# Patient Record
Sex: Female | Born: 1975
Health system: Southern US, Community
[De-identification: ages and names within clinical notes are randomized; demographics above are authoritative.]

## PROBLEM LIST (undated history)

## (undated) DIAGNOSIS — M545 Low back pain, unspecified: Secondary | ICD-10-CM

## (undated) DIAGNOSIS — T50902A Poisoning by unspecified drugs, medicaments and biological substances, intentional self-harm, initial encounter: Secondary | ICD-10-CM

## (undated) DIAGNOSIS — I499 Cardiac arrhythmia, unspecified: Secondary | ICD-10-CM

## (undated) DIAGNOSIS — Z8619 Personal history of other infectious and parasitic diseases: Secondary | ICD-10-CM

## (undated) DIAGNOSIS — K219 Gastro-esophageal reflux disease without esophagitis: Secondary | ICD-10-CM

## (undated) DIAGNOSIS — F329 Major depressive disorder, single episode, unspecified: Secondary | ICD-10-CM

## (undated) DIAGNOSIS — G4700 Insomnia, unspecified: Secondary | ICD-10-CM

## (undated) DIAGNOSIS — F32A Depression, unspecified: Secondary | ICD-10-CM

## (undated) DIAGNOSIS — O09529 Supervision of elderly multigravida, unspecified trimester: Secondary | ICD-10-CM

## (undated) DIAGNOSIS — F419 Anxiety disorder, unspecified: Secondary | ICD-10-CM

## (undated) HISTORY — DX: Low back pain: M54.5

## (undated) HISTORY — DX: Personal history of other infectious and parasitic diseases: Z86.19

## (undated) HISTORY — DX: Cardiac arrhythmia, unspecified: I49.9

## (undated) HISTORY — DX: Anxiety disorder, unspecified: F41.9

## (undated) HISTORY — PX: NO PAST SURGERIES: SHX2092

## (undated) HISTORY — DX: Supervision of elderly multigravida, unspecified trimester: O09.529

## (undated) HISTORY — DX: Low back pain, unspecified: M54.50

---

## 2005-05-27 ENCOUNTER — Other Ambulatory Visit: Admission: RE | Admit: 2005-05-27 | Discharge: 2005-05-27 | Payer: Self-pay | Admitting: Family Medicine

## 2006-11-15 ENCOUNTER — Other Ambulatory Visit: Admission: RE | Admit: 2006-11-15 | Discharge: 2006-11-15 | Payer: Self-pay | Admitting: Family Medicine

## 2008-11-28 ENCOUNTER — Encounter: Admission: RE | Admit: 2008-11-28 | Discharge: 2008-12-30 | Payer: Self-pay | Admitting: Family Medicine

## 2011-01-07 DIAGNOSIS — L720 Epidermal cyst: Secondary | ICD-10-CM | POA: Insufficient documentation

## 2011-01-07 DIAGNOSIS — G8929 Other chronic pain: Secondary | ICD-10-CM | POA: Insufficient documentation

## 2011-01-07 DIAGNOSIS — J309 Allergic rhinitis, unspecified: Secondary | ICD-10-CM | POA: Insufficient documentation

## 2011-01-07 DIAGNOSIS — G4700 Insomnia, unspecified: Secondary | ICD-10-CM | POA: Insufficient documentation

## 2011-06-12 DIAGNOSIS — T50902A Poisoning by unspecified drugs, medicaments and biological substances, intentional self-harm, initial encounter: Secondary | ICD-10-CM

## 2011-06-12 HISTORY — DX: Poisoning by unspecified drugs, medicaments and biological substances, intentional self-harm, initial encounter: T50.902A

## 2011-07-08 ENCOUNTER — Emergency Department (HOSPITAL_COMMUNITY)
Admission: EM | Admit: 2011-07-08 | Discharge: 2011-07-08 | Disposition: A | Discharge status: Home / Self Care | Payer: PRIVATE HEALTH INSURANCE | Attending: Emergency Medicine | Admitting: Emergency Medicine

## 2011-07-08 ENCOUNTER — Encounter (HOSPITAL_COMMUNITY): Payer: Self-pay | Admitting: *Deleted

## 2011-07-08 DIAGNOSIS — T4271XA Poisoning by unspecified antiepileptic and sedative-hypnotic drugs, accidental (unintentional), initial encounter: Secondary | ICD-10-CM | POA: Insufficient documentation

## 2011-07-08 DIAGNOSIS — T4272XA Poisoning by unspecified antiepileptic and sedative-hypnotic drugs, intentional self-harm, initial encounter: Secondary | ICD-10-CM | POA: Insufficient documentation

## 2011-07-08 DIAGNOSIS — Y92009 Unspecified place in unspecified non-institutional (private) residence as the place of occurrence of the external cause: Secondary | ICD-10-CM | POA: Insufficient documentation

## 2011-07-08 DIAGNOSIS — T426X1A Poisoning by other antiepileptic and sedative-hypnotic drugs, accidental (unintentional), initial encounter: Secondary | ICD-10-CM | POA: Insufficient documentation

## 2011-07-08 DIAGNOSIS — T426X2A Poisoning by other antiepileptic and sedative-hypnotic drugs, intentional self-harm, initial encounter: Secondary | ICD-10-CM | POA: Insufficient documentation

## 2011-07-08 DIAGNOSIS — F329 Major depressive disorder, single episode, unspecified: Secondary | ICD-10-CM | POA: Insufficient documentation

## 2011-07-08 DIAGNOSIS — Z79899 Other long term (current) drug therapy: Secondary | ICD-10-CM | POA: Insufficient documentation

## 2011-07-08 DIAGNOSIS — F3289 Other specified depressive episodes: Secondary | ICD-10-CM | POA: Insufficient documentation

## 2011-07-08 DIAGNOSIS — T50902A Poisoning by unspecified drugs, medicaments and biological substances, intentional self-harm, initial encounter: Secondary | ICD-10-CM

## 2011-07-08 HISTORY — DX: Poisoning by unspecified drugs, medicaments and biological substances, intentional self-harm, initial encounter: T50.902A

## 2011-07-08 HISTORY — DX: Insomnia, unspecified: G47.00

## 2011-07-08 HISTORY — DX: Depression, unspecified: F32.A

## 2011-07-08 HISTORY — DX: Major depressive disorder, single episode, unspecified: F32.9

## 2011-07-08 LAB — SALICYLATE LEVEL: Salicylate Lvl: <2 mg/dL — ABNORMAL LOW (ref 2.8–20.0)

## 2011-07-08 LAB — COMPREHENSIVE METABOLIC PANEL
AST: 19 U/L (ref 0–37)
CO2: 23 mEq/L (ref 19–32)
Chloride: 106 mEq/L (ref 96–112)
Creatinine, Ser: 0.74 mg/dL (ref 0.50–1.10)
GFR calc Af Amer: >90 mL/min (ref >90)
GFR calc non Af Amer: >90 mL/min (ref >90)
Glucose, Bld: 95 mg/dL (ref 70–99)
Total Bilirubin: 0.2 mg/dL — ABNORMAL LOW (ref 0.3–1.2)

## 2011-07-08 LAB — CBC
HCT: 35.8 % — ABNORMAL LOW (ref 36.0–46.0)
Hemoglobin: 12.6 g/dL (ref 12.0–15.0)
MCV: 89.1 fL (ref 78.0–100.0)
Platelets: 280 10*3/uL (ref 150–400)
RBC: 4.02 MIL/uL (ref 3.87–5.11)
WBC: 7.2 10*3/uL (ref 4.0–10.5)

## 2011-07-08 LAB — ACETAMINOPHEN LEVEL: Acetaminophen (Tylenol), Serum: <15 ug/mL (ref 10–30)

## 2011-07-08 LAB — PREGNANCY, URINE: Preg Test, Ur: NEGATIVE

## 2011-07-08 LAB — RAPID URINE DRUG SCREEN, HOSP PERFORMED
Amphetamines: NOT DETECTED
Tetrahydrocannabinol: NOT DETECTED

## 2011-07-08 MED ORDER — IBUPROFEN 600 MG PO TABS: 600.0000 mg | ORAL_TABLET | Freq: Three times a day (TID) | ORAL | Status: DC | PRN

## 2011-07-08 MED ORDER — TRAZODONE HCL 50 MG PO TABS: 50.0000 mg | ORAL_TABLET | Freq: Every evening | ORAL | Status: DC | PRN

## 2011-07-08 MED ORDER — SERTRALINE HCL 50 MG PO TABS
100.0000 mg | ORAL_TABLET | Freq: Every day | ORAL | Status: DC
  Administered 2011-07-08: 100 mg via ORAL
  Filled 2011-07-08: qty 2

## 2011-07-08 MED ORDER — CETIRIZINE HCL 5 MG/5ML PO SYRP
10.0000 mg | ORAL_SOLUTION | Freq: Every day | ORAL | Status: DC
  Administered 2011-07-08: 10 mg via ORAL
  Filled 2011-07-08: qty 10

## 2011-07-08 NOTE — ED Provider Notes (Addendum)
BP 115/76  Pulse 79  Temp 98.2 F (36.8 C) (Oral)  Resp 16  SpO2 99%  Patient seen and evaluated by me. No complaints at this time.   Forbes Cellar, MD 07/08/11 [phone]  Telepsych reviewed. Recommends discharge home. Pt alert and cooperative at this time.   Forbes Cellar, MD 07/08/11 1007

## 2011-07-08 NOTE — ED Notes (Signed)
Pt. Belonging were taken home by a friend Estate manager/land agent)

## 2011-07-08 NOTE — ED Notes (Signed)
Tele-psych consult in process.

## 2011-07-08 NOTE — ED Provider Notes (Signed)
History     CSN: [id number]  Arrival date & time 07/08/11  0013   First MD Initiated Contact with Patient 07/08/11 0147      Chief Complaint  Patient presents with  . Drug Overdose    (Consider location/radiation/quality/duration/timing/severity/associated sxs/prior treatment) HPI Level 5 Caveat: altered mental status. This is a 36 year old black female who took about 6 Ambien and was at about 8:00 while having a text message argument with a friend. Her messages suggested that she was attempting to kill herself. About 10 PM her text messages became unintelligible and her friend drove from [place] to check on her. He found her to be somnolent and called EMS. She continues to be somnolent but will arouse briefly.  Past Medical History  Diagnosis Date  . Depression   . Insomnia     History reviewed. No pertinent past surgical history.  No family history on file.  History  Substance Use Topics  . Smoking status: Not on file  . Smokeless tobacco: Not on file  . Alcohol Use:     OB History    Grav Para Term Preterm Abortions TAB SAB Ect Mult Living                  Review of Systems  Unable to perform ROS   Allergies  Review of patient's allergies indicates no known allergies.  Home Medications   Current Outpatient Rx  Name Route Sig Dispense Refill  . CETIRIZINE HCL 10 MG PO TABS Oral Take 10 mg by mouth daily.    Marland Kitchen DIPHENHYDRAMINE HCL 25 MG PO TABS Oral Take 50 mg by mouth at bedtime as needed. sleep    . DOXYCYCLINE HYCLATE 100 MG PO TABS Oral Take 100 mg by mouth 2 (two) times daily.    Marland Kitchen HYDROCODONE-ACETAMINOPHEN 5-500 MG PO TABS Oral Take 1 tablet by mouth daily as needed. Severe pain    . IBUPROFEN 800 MG PO TABS Oral Take 800 mg by mouth every 8 (eight) hours as needed. Pain    . LOPERAMIDE HCL 2 MG PO CAPS Oral Take 2 mg by mouth 4 (four) times daily as needed. Diarrheal    . MELOXICAM 15 MG PO TABS Oral Take 15 mg by mouth daily.    Marland Kitchen PHENTERMINE  HCL 37.5 MG PO CAPS Oral Take 37.5 mg by mouth every morning.    Marland Kitchen PROMETHAZINE HCL 25 MG PO TABS Oral Take 25 mg by mouth every 6 (six) hours as needed. nausea    . SERTRALINE HCL 100 MG PO TABS Oral Take 100 mg by mouth daily.    . TRAZODONE HCL 50 MG PO TABS Oral Take 50-100 mg by mouth at bedtime.    Marland Kitchen ZOLPIDEM TARTRATE ER 12.5 MG PO TBCR Oral Take 12.5 mg by mouth at bedtime as needed. Sleep      BP 109/67  Pulse 105  Temp 98.1 F (36.7 C) (Oral)  Resp 26  SpO2 100%  Physical Exam General: Well-developed, well-nourished female in no acute distress; appearance consistent with age of record HENT: normocephalic, atraumatic Eyes: pupils equal round and reactive to light; extraocular muscles intact Neck: supple Heart: regular rate and rhythm Lungs: clear to auscultation bilaterally Abdomen: soft; nondistended Extremities: No deformity; full range of motion; pulses normal Neurologic: Somnolent but arousable; noted to move all extremities  Skin: Warm and dry Psychiatric: Admits to taking Ambien in an attempt to escape    ED Course  Procedures (including critical care time)  MDM   Nursing notes and vitals signs, including pulse oximetry, reviewed.  Summary of this visit's results, reviewed by myself:  Labs:  Results for orders placed during the hospital encounter of 07/08/11  CBC      Component Value Range   WBC 7.2  4.0 - 10.5 K/uL   RBC 4.02  3.87 - 5.11 MIL/uL   Hemoglobin 12.6  12.0 - 15.0 g/dL   HCT 40.9 (*) 81.1 - 91.4 %   MCV 89.1  78.0 - 100.0 fL   MCH 31.3  26.0 - 34.0 pg   MCHC 35.2  30.0 - 36.0 g/dL   RDW 78.2  95.6 - 21.3 %   Platelets 280  150 - 400 K/uL  COMPREHENSIVE METABOLIC PANEL      Component Value Range   Sodium 136  135 - 145 mEq/L   Potassium 4.1  3.5 - 5.1 mEq/L   Chloride 106  96 - 112 mEq/L   CO2 23  19 - 32 mEq/L   Glucose, Bld 95  70 - 99 mg/dL   BUN 17  6 - 23 mg/dL   Creatinine, Ser 0.86  0.50 - 1.10 mg/dL   Calcium 8.8  8.4  - 57.8 mg/dL   Total Protein 6.4  6.0 - 8.3 g/dL   Albumin 3.4 (*) 3.5 - 5.2 g/dL   AST 19  0 - 37 U/L   ALT 16  0 - 35 U/L   Alkaline Phosphatase 73  39 - 117 U/L   Total Bilirubin 0.2 (*) 0.3 - 1.2 mg/dL   GFR calc non Af Amer >90  >90 mL/min   GFR calc Af Amer >90  >90 mL/min  ETHANOL      Component Value Range   Alcohol, Ethyl (B) <11  0 - 11 mg/dL  ACETAMINOPHEN LEVEL      Component Value Range   Acetaminophen (Tylenol), Serum <15.0  10 - 30 ug/mL  URINE RAPID DRUG SCREEN (HOSP PERFORMED)      Component Value Range   Opiates NONE DETECTED  NONE DETECTED   Cocaine NONE DETECTED  NONE DETECTED   Benzodiazepines NONE DETECTED  NONE DETECTED   Amphetamines NONE DETECTED  NONE DETECTED   Tetrahydrocannabinol NONE DETECTED  NONE DETECTED   Barbiturates NONE DETECTED  NONE DETECTED  PREGNANCY, URINE      Component Value Range   Preg Test, Ur NEGATIVE  NEGATIVE  SALICYLATE LEVEL      Component Value Range   Salicylate Lvl <2.0 (*) 2.8 - 20.0 mg/dL      EKG Interpretation:  Date & Time: 07/08/2011 12:20 AM  Rate: 101  Rhythm: sinus tachycardia  QRS Axis: normal  Intervals: PR prolonged  ST/T Wave abnormalities: normal  Conduction Disutrbances:first-degree A-V block   Narrative Interpretation:   Old EKG Reviewed: none available  5:35 AM Patient now awake. ACT will evaluate.        Hanley Seamen, MD 07/08/11 [phone]

## 2011-07-08 NOTE — Discharge Instructions (Signed)
Overdose, Adult     A person can overdose on alcohol, drugs or both by accident or on purpose. If it was on purpose, it is a serious matter. Professional help should be sought. If the overdose was an accident, certain steps should be taken to make sure that it never happens again.  ACCIDENTAL OVERDOSE  Overdosing on prescription medications can be a result of:  · Not understanding the instructions.   · Misreading the label.   · Forgetting that you took a dose and then taking another by mistake. This situation happens a lot.   To make sure this does not happen again:  · Clarify the correct dosage with your caregiver.   · Place the correct dosage in a "pill-minder" container (labeled for each day and time of day).   · Have someone dispense your medicine.      Please be sure to follow-up with your primary care doctor as directed.  INTENTIONAL OVERDOSE  If the overdose was on purpose, it is a serious situation. Taking more than the prescribed amount of medications (including taking someone else's prescription), abusing street drugs or drinking an amount of alcohol that requires medical treatment can show a variety of possible problems. These may indicate you:  · Are depressed or suicidal.   · Are abusing drugs, took too much or combined different drugs to experiment with the effects.   · Mixed alcohol with drugs and did not realize the danger of doing so (this is drug abuse).   · Are suffering addiction to drugs and/or alcohol (also known as chemical dependency).   · Binge drink.   If you have not been referred to a mental health professional for help, it is important that you get help right away. Only a professional can determine which problems may exist and what the best course of treatment may be. It is your responsibility to follow-up with further evaluation or treatment as directed.   Alcohol is responsible for a large number of overdoses and unintended deaths among college-age young adults. Binge drinking is  consuming 4-5 drinks in a short period of time. The amount of alcohol in standard servings of wine (5 oz.), beer (12 oz.) and distilled spirits (1.5 oz., 80 proof) is the same. Beer or wine can be just as dangerous to the binge drinker as "hard" liquor can be.   CONSEQUENCES OF BINGE DRINKING  Alcohol poisoning is the most serious consequence of binge drinking. This is a severe and potentially fatal physical reaction to an alcohol overdose. When too much alcohol is consumed, the brain does not get enough oxygen. The lack of oxygen will eventually cause the brain to shut down the voluntary functions that regulate breathing and heart rate. Symptoms of alcohol poisoning include:  · Vomiting.   · Passing out (unconsciousness).   · Cold, clammy, pale or bluish skin.   · Slow or irregular breathing.   WHAT SHOULD I DO NEXT?  If you have a history of drug abuse or suffer chemical dependency (alcoholism, drug addiction or both), you might consider the following:  · Talk with a qualified substance abuse counselor and consider entering a treatment program.   · Go to a detox facility if necessary.   · If you were attending self-help group meetings, consider returning to them and go often.   · Explore other resources located near you (see sources listed below).   If you are unsure if you have a substance abuse problem, ask yourself the   do while using)?   Do you lie about use or amounts of drugs or alcohol you consume?   Do you need chemicals to get you going?   Do you suffer in work or school performance because of drug or alcohol use?   Do you get sick from drug or alcohol use but continue to use anyway?   Do you need drugs or alcohol to relate to people or feel comfortable in social situations?   Do you use drugs  or alcohol to forget problems?  If you answered "Yes" to any of the above questions, it means you show signs of chemical dependency and a professional evaluation is suggested. The longer the use of drugs and alcohol continues, the problems will become greater. SEEK IMMEDIATE MEDICAL CARE IF:   You feel like you might repeat your problematic behavior.   You need someone to talk to and feel that it should not wait.   You feel you are a danger to yourself or someone else.   You feel like you are having a new reaction to medications you are taking, or you are getting worse after leaving a care center.   You have an overwhelming urge to drink or use drugs.  Addiction cannot be cured, but it can be treated successfully. Treatment centers are listed in the yellow pages under: Cocaine, Narcotics, and Alcoholics Anonymous. Most hospitals and clinics can refer you to a specialized care center. The [place] government maintains a toll-free number for obtaining treatment referrals: (628)251-5229 or (403)130-2556 (TDD) and maintains a website: http://findtreatment.RockToxic.pl. Other websites for additional information are: www.mentalhealth.RockToxic.pl. and GreatestFeeling.tn. In [place] treatment resources are listed in each [place] with listings available under [employer] for [employer] or similar titles. Document Released: 12/31/2002 Document Revised: 12/17/2010 Document Reviewed: 11/22/2007 [facility] Patient Information 2012 [place], [place].  RESOURCE GUIDE  Dental Problems  Patients with Medicaid: [facility] [phone] [address]                                           [phone] Phone:  [phone]                                                   Phone:  [phone]  If unable to pay or uninsured, contact:  [facility] or [facility]. to become qualified for the adult dental clinic.  Chronic Pain Problems Contact [facility]  [phone] Patients need to be referred by their primary care doctor.  Insufficient Money for Medicine Contact United Way:  call "211" or [facility] (475)670-6941.  No Primary Care Doctor Call [facility]  [phone] Other agencies that provide inexpensive medical care    [facility]  [phone]    [facility]  [phone]    [facility]  [phone]    [facility]  [phone]    [facility]  [phone]    [facility]  [phone]  [facility]  [phone] [place] [facility]  [phone] [facility] Mental Health  800 G6628420 (emergency services [phone])  Abuse/Neglect [facility] Child Abuse Hotline (470) 716-0916 [facility] Child Abuse Hotline (217)682-0020 (After Hours)  Emergency Shelter [facility] 8733564062  [facility] [place] [facility] (640) 245-4526 [facility] [phone]  MRSA Hotline #:   (570)313-2657    [facility] Resources  [facility]  United Way                           [facility] Dept. [address] [place]                     [address]         [address] [place] [address]  [place]ondell Reveal Phone:  [phone]                                  Phone:  [phone]                   Phone:  [phone]  [facility] Mental Health Phone:  [phone]  [facility] Child Abuse Hotline 818 001 9618 [phone] (After Hours)

## 2011-07-08 NOTE — ED Notes (Signed)
ZOX:WRUE<AV> Expected date:07/07/11<BR> Expected time:11:57 PM<BR> Means of arrival:Ambulance<BR> Comments:<BR> Overdose 5 ambien

## 2011-07-08 NOTE — ED Notes (Signed)
Pt informed physician won't be in until 1300. Pt reports she took the pills, because someone made her mad when they wouldn't answer their phone. Pt states she "feels okay".

## 2011-07-08 NOTE — ED Notes (Signed)
Tele-psych consult completed.   

## 2011-07-08 NOTE — ED Notes (Signed)
Pt texted a friend telling them she was going to kill herself; texted picture of empty pill bottle; When friend arrived at pt house she was unresponsive -- he woke her up and she admitted to taking 5 ambien.  Per EMS pt lethargic. VSS.

## 2011-10-07 DIAGNOSIS — R55 Syncope and collapse: Secondary | ICD-10-CM | POA: Insufficient documentation

## 2012-01-12 NOTE — L&D Delivery Note (Signed)
Delivery Note At 10:27 AM a viable female was delivered via  (Presentation: ;  ).  APGAR: , ; weight .   Placenta status:spont>>intact , .  Cord:  with the following complications: .  Cord pH: not sent  Anesthesia: Epidural  Episiotomy: none Lacerations:sec deg Suture Repair: 3.0 vicryl rapide Est. Blood Loss (mL): 300  Mom to postpartum.  Baby to nursery-stable.  Shawne Bulow M 09/16/2012, 10:40 AM

## 2012-02-11 LAB — OB RESULTS CONSOLE GC/CHLAMYDIA: Gonorrhea: NEGATIVE

## 2012-02-11 LAB — OB RESULTS CONSOLE ABO/RH

## 2012-02-11 LAB — OB RESULTS CONSOLE RUBELLA ANTIBODY, IGM: Rubella: IMMUNE

## 2012-02-11 LAB — OB RESULTS CONSOLE HIV ANTIBODY (ROUTINE TESTING): HIV: NONREACTIVE

## 2012-03-23 ENCOUNTER — Encounter: Payer: Self-pay | Admitting: Certified Nurse Midwife

## 2012-09-08 ENCOUNTER — Telehealth (HOSPITAL_COMMUNITY): Payer: Self-pay | Admitting: *Deleted

## 2012-09-08 ENCOUNTER — Encounter (HOSPITAL_COMMUNITY): Payer: Self-pay | Admitting: *Deleted

## 2012-09-08 NOTE — Telephone Encounter (Signed)
Preadmission screen  

## 2012-09-15 ENCOUNTER — Inpatient Hospital Stay (HOSPITAL_COMMUNITY)
Admission: AD | Admit: 2012-09-15 | Discharge: 2012-09-18 | DRG: 775 | Disposition: A | Discharge status: Home / Self Care | Payer: 59 | Source: Ambulatory Visit | Attending: Obstetrics and Gynecology | Admitting: Obstetrics and Gynecology

## 2012-09-15 ENCOUNTER — Encounter (HOSPITAL_COMMUNITY): Payer: Self-pay | Admitting: *Deleted

## 2012-09-15 DIAGNOSIS — O09529 Supervision of elderly multigravida, unspecified trimester: Secondary | ICD-10-CM | POA: Diagnosis present

## 2012-09-15 LAB — URINALYSIS, ROUTINE W REFLEX MICROSCOPIC
Bilirubin Urine: NEGATIVE
Specific Gravity, Urine: >1.03 — ABNORMAL HIGH (ref 1.005–1.030)
Urobilinogen, UA: 0.2 mg/dL (ref 0.0–1.0)

## 2012-09-15 LAB — URINE MICROSCOPIC-ADD ON

## 2012-09-15 MED ORDER — LACTATED RINGERS IV SOLN
INTRAVENOUS | Status: DC
  Administered 2012-09-15 – 2012-09-16 (×3): via INTRAVENOUS

## 2012-09-15 NOTE — MAU Note (Signed)
Pt state U/C's started today about 1300.  Unable to time them.  Bloody show without ROM.  Good fetal movement.

## 2012-09-15 NOTE — MAU Note (Addendum)
SVE unchanged. Discussed with pt no progression and probable discharge home. Pt and family upset about how much pain pt is in and concerned about insurance if she goes home and has to return, pt aware that we can send her home with something for pain. Pt"s husband states they are adamantly opposed to going home and pt states she cannot go home in this much pain. Call to dr Marcelle Overlie and notified him of SVE and conversation with pt. Notified that family wishes to speak to him, per dr Marcelle Overlie , admit pt.

## 2012-09-16 ENCOUNTER — Encounter (HOSPITAL_COMMUNITY): Payer: Self-pay | Admitting: *Deleted

## 2012-09-16 ENCOUNTER — Encounter (HOSPITAL_COMMUNITY): Payer: Self-pay | Admitting: Anesthesiology

## 2012-09-16 ENCOUNTER — Inpatient Hospital Stay (HOSPITAL_COMMUNITY): Payer: 59 | Admitting: Anesthesiology

## 2012-09-16 LAB — CBC
HCT: 33.3 % — ABNORMAL LOW (ref 36.0–46.0)
MCH: 28.5 pg (ref 26.0–34.0)
MCHC: 33.9 g/dL (ref 30.0–36.0)
MCV: 84.1 fL (ref 78.0–100.0)
Platelets: 266 10*3/uL (ref 150–400)
RDW: 17 % — ABNORMAL HIGH (ref 11.5–15.5)
WBC: 12.3 10*3/uL — ABNORMAL HIGH (ref 4.0–10.5)

## 2012-09-16 LAB — ABO/RH: ABO/RH(D): B POS

## 2012-09-16 MED ORDER — BISACODYL 10 MG RE SUPP: 10.0000 mg | Freq: Every day | RECTAL | Status: DC | PRN

## 2012-09-16 MED ORDER — DIBUCAINE 1 % RE OINT: 1.0000 "application " | TOPICAL_OINTMENT | RECTAL | Status: DC | PRN

## 2012-09-16 MED ORDER — ACETAMINOPHEN 325 MG PO TABS: 650.0000 mg | ORAL_TABLET | ORAL | Status: DC | PRN

## 2012-09-16 MED ORDER — FLEET ENEMA 7-19 GM/118ML RE ENEM: 1.0000 | ENEMA | Freq: Every day | RECTAL | Status: DC | PRN

## 2012-09-16 MED ORDER — OXYTOCIN BOLUS FROM INFUSION
500.0000 mL | INTRAVENOUS | Status: DC
  Administered 2012-09-16: 500 mL via INTRAVENOUS

## 2012-09-16 MED ORDER — OXYTOCIN 40 UNITS IN LACTATED RINGERS INFUSION - SIMPLE MED
62.5000 mL/h | INTRAVENOUS | Status: DC
  Filled 2012-09-16: qty 1000

## 2012-09-16 MED ORDER — TETANUS-DIPHTH-ACELL PERTUSSIS 5-2.5-18.5 LF-MCG/0.5 IM SUSP: 0.5000 mL | Freq: Once | INTRAMUSCULAR | Status: DC

## 2012-09-16 MED ORDER — LACTATED RINGERS IV SOLN
500.0000 mL | INTRAVENOUS | Status: DC | PRN
  Administered 2012-09-16 (×2): 500 mL via INTRAVENOUS

## 2012-09-16 MED ORDER — SENNOSIDES-DOCUSATE SODIUM 8.6-50 MG PO TABS
2.0000 | ORAL_TABLET | Freq: Every day | ORAL | Status: DC
  Administered 2012-09-16 – 2012-09-17 (×2): 2 via ORAL

## 2012-09-16 MED ORDER — CITRIC ACID-SODIUM CITRATE 334-500 MG/5ML PO SOLN: 30.0000 mL | ORAL | Status: DC | PRN

## 2012-09-16 MED ORDER — ZOLPIDEM TARTRATE 5 MG PO TABS
5.0000 mg | ORAL_TABLET | Freq: Every evening | ORAL | Status: DC | PRN
  Administered 2012-09-17: 5 mg via ORAL
  Filled 2012-09-16 (×2): qty 1

## 2012-09-16 MED ORDER — PHENYLEPHRINE 40 MCG/ML (10ML) SYRINGE FOR IV PUSH (FOR BLOOD PRESSURE SUPPORT)
80.0000 ug | PREFILLED_SYRINGE | INTRAVENOUS | Status: DC | PRN
  Filled 2012-09-16: qty 2
  Filled 2012-09-16: qty 5

## 2012-09-16 MED ORDER — IBUPROFEN 600 MG PO TABS
600.0000 mg | ORAL_TABLET | Freq: Four times a day (QID) | ORAL | Status: DC | PRN
  Administered 2012-09-16: 600 mg via ORAL
  Filled 2012-09-16: qty 1

## 2012-09-16 MED ORDER — LIDOCAINE HCL (PF) 1 % IJ SOLN
INTRAMUSCULAR | Status: DC | PRN
  Administered 2012-09-16 (×2): 9 mL

## 2012-09-16 MED ORDER — BENZOCAINE-MENTHOL 20-0.5 % EX AERO
1.0000 "application " | INHALATION_SPRAY | CUTANEOUS | Status: DC | PRN
  Filled 2012-09-16: qty 56

## 2012-09-16 MED ORDER — LORATADINE 10 MG PO TABS
10.0000 mg | ORAL_TABLET | Freq: Every day | ORAL | Status: DC
  Administered 2012-09-18: 10 mg via ORAL
  Filled 2012-09-16 (×3): qty 1

## 2012-09-16 MED ORDER — SERTRALINE HCL 25 MG PO TABS
25.0000 mg | ORAL_TABLET | Freq: Every day | ORAL | Status: DC
  Administered 2012-09-16 – 2012-09-18 (×3): 25 mg via ORAL
  Filled 2012-09-16 (×3): qty 1

## 2012-09-16 MED ORDER — DIPHENHYDRAMINE HCL 50 MG/ML IJ SOLN: 12.5000 mg | INTRAMUSCULAR | Status: DC | PRN

## 2012-09-16 MED ORDER — LIDOCAINE HCL (PF) 1 % IJ SOLN
30.0000 mL | INTRAMUSCULAR | Status: DC | PRN
  Filled 2012-09-16 (×2): qty 30

## 2012-09-16 MED ORDER — LACTATED RINGERS IV SOLN
500.0000 mL | Freq: Once | INTRAVENOUS | Status: AC
  Administered 2012-09-16: 500 mL via INTRAVENOUS

## 2012-09-16 MED ORDER — WITCH HAZEL-GLYCERIN EX PADS: 1.0000 "application " | MEDICATED_PAD | CUTANEOUS | Status: DC | PRN

## 2012-09-16 MED ORDER — PRENATAL MULTIVITAMIN CH
1.0000 | ORAL_TABLET | Freq: Every day | ORAL | Status: DC
  Administered 2012-09-16 – 2012-09-17 (×2): 1 via ORAL
  Filled 2012-09-16 (×2): qty 1

## 2012-09-16 MED ORDER — ONDANSETRON HCL 4 MG/2ML IJ SOLN: 4.0000 mg | INTRAMUSCULAR | Status: DC | PRN

## 2012-09-16 MED ORDER — EPHEDRINE 5 MG/ML INJ
10.0000 mg | INTRAVENOUS | Status: DC | PRN
  Filled 2012-09-16: qty 2
  Filled 2012-09-16: qty 4

## 2012-09-16 MED ORDER — EPHEDRINE 5 MG/ML INJ
10.0000 mg | INTRAVENOUS | Status: DC | PRN
  Filled 2012-09-16: qty 2

## 2012-09-16 MED ORDER — FENTANYL 2.5 MCG/ML BUPIVACAINE 1/10 % EPIDURAL INFUSION (WH - ANES)
INTRAMUSCULAR | Status: DC | PRN
Start: 2012-09-16 — End: 2012-09-17
  Administered 2012-09-16: 14 mL/h via EPIDURAL

## 2012-09-16 MED ORDER — OXYCODONE-ACETAMINOPHEN 5-325 MG PO TABS
1.0000 | ORAL_TABLET | Freq: Four times a day (QID) | ORAL | Status: DC | PRN
  Administered 2012-09-16 (×2): 1 via ORAL
  Filled 2012-09-16 (×2): qty 1

## 2012-09-16 MED ORDER — ONDANSETRON HCL 4 MG/2ML IJ SOLN: 4.0000 mg | Freq: Four times a day (QID) | INTRAMUSCULAR | Status: DC | PRN

## 2012-09-16 MED ORDER — MEASLES, MUMPS & RUBELLA VAC ~~LOC~~ INJ
0.5000 mL | INJECTION | Freq: Once | SUBCUTANEOUS | Status: DC
  Filled 2012-09-16: qty 0.5

## 2012-09-16 MED ORDER — PHENYLEPHRINE 40 MCG/ML (10ML) SYRINGE FOR IV PUSH (FOR BLOOD PRESSURE SUPPORT)
80.0000 ug | PREFILLED_SYRINGE | INTRAVENOUS | Status: DC | PRN
  Filled 2012-09-16: qty 2

## 2012-09-16 MED ORDER — OXYCODONE-ACETAMINOPHEN 5-325 MG PO TABS: 1.0000 | ORAL_TABLET | ORAL | Status: DC | PRN

## 2012-09-16 MED ORDER — SIMETHICONE 80 MG PO CHEW: 80.0000 mg | CHEWABLE_TABLET | ORAL | Status: DC | PRN

## 2012-09-16 MED ORDER — LANOLIN HYDROUS EX OINT: TOPICAL_OINTMENT | CUTANEOUS | Status: DC | PRN

## 2012-09-16 MED ORDER — FLEET ENEMA 7-19 GM/118ML RE ENEM: 1.0000 | ENEMA | RECTAL | Status: DC | PRN

## 2012-09-16 MED ORDER — IBUPROFEN 800 MG PO TABS
800.0000 mg | ORAL_TABLET | Freq: Three times a day (TID) | ORAL | Status: DC | PRN
  Administered 2012-09-17 – 2012-09-18 (×3): 800 mg via ORAL
  Filled 2012-09-16 (×4): qty 1

## 2012-09-16 MED ORDER — FENTANYL 2.5 MCG/ML BUPIVACAINE 1/10 % EPIDURAL INFUSION (WH - ANES)
14.0000 mL/h | INTRAMUSCULAR | Status: DC | PRN
  Administered 2012-09-16: 14 mL/h via EPIDURAL
  Filled 2012-09-16 (×2): qty 125

## 2012-09-16 MED ORDER — DIPHENHYDRAMINE HCL 25 MG PO CAPS: 25.0000 mg | ORAL_CAPSULE | Freq: Four times a day (QID) | ORAL | Status: DC | PRN

## 2012-09-16 MED ORDER — ONDANSETRON HCL 4 MG PO TABS: 4.0000 mg | ORAL_TABLET | ORAL | Status: DC | PRN

## 2012-09-16 NOTE — Anesthesia Procedure Notes (Signed)
Epidural Patient location during procedure: OB Start time: 09/16/2012 2:52 AM End time: 09/16/2012 2:56 AM  Staffing Anesthesiologist: Sandrea Hughs Performed by: anesthesiologist   Preanesthetic Checklist Completed: patient identified, surgical consent, pre-op evaluation, timeout performed, IV checked, risks and benefits discussed and monitors and equipment checked  Epidural Patient position: sitting Prep: site prepped and draped and DuraPrep Patient monitoring: continuous pulse ox and blood pressure Approach: midline Injection technique: LOR air  Needle:  Needle type: Tuohy  Needle gauge: 17 G Needle length: 9 cm and 9 Needle insertion depth: 6 cm Catheter type: closed end flexible Catheter size: 19 Gauge Catheter at skin depth: 11 cm Test dose: negative and Other  Assessment Sensory level: T9 Events: blood not aspirated, injection not painful, no injection resistance, negative IV test and no paresthesia  Additional Notes Reason for block:procedure for pain

## 2012-09-16 NOTE — Anesthesia Preprocedure Evaluation (Signed)
Anesthesia Evaluation  Patient identified by MRN, date of birth, ID band Patient awake    Reviewed: Allergy & Precautions, H&P , NPO status , Patient's Chart, lab work & pertinent test results  Airway Mallampati: II TM Distance: >3 FB Neck ROM: full    Dental no notable dental hx.    Pulmonary neg pulmonary ROS,    Pulmonary exam normal       Cardiovascular negative cardio ROS      Neuro/Psych PSYCHIATRIC DISORDERS Anxiety Depression negative neurological ROS     GI/Hepatic negative GI ROS, Neg liver ROS,   Endo/Other  Morbid obesity  Renal/GU negative Renal ROS     Musculoskeletal   Abdominal (+) + obese,   Peds  Hematology negative hematology ROS (+)   Anesthesia Other Findings   Reproductive/Obstetrics (+) Pregnancy                           Anesthesia Physical Anesthesia Plan  ASA: III  Anesthesia Plan: Epidural   Post-op Pain Management:    Induction:   Airway Management Planned:   Additional Equipment:   Intra-op Plan:   Post-operative Plan:   Informed Consent: I have reviewed the patients History and Physical, chart, labs and discussed the procedure including the risks, benefits and alternatives for the proposed anesthesia with the patient or authorized representative who has indicated his/her understanding and acceptance.     Plan Discussed with:   Anesthesia Plan Comments:         Anesthesia Quick Evaluation

## 2012-09-16 NOTE — H&P (Signed)
Maria Schaefer is a 37 y.o. female presenting for labor. Maternal Medical History:  Reason for admission: Contractions.   Contractions: Onset was 3-5 hours ago.   Frequency: regular.   Perceived severity is moderate.    Fetal activity: Perceived fetal activity is normal.   Last perceived fetal movement was within the past hour.      OB History   Grav Para Term Preterm Abortions TAB SAB Ect Mult Living   2 0   1  1   0     Past Medical History  Diagnosis Date  . Insomnia   . Hx of varicella   . Dysrhythmia     PVSs  . Anxiety   . Depression     tried to harm herself once  . Intermittent low back pain   . AMA (advanced maternal age) multigravida 35+    Past Surgical History  Procedure Laterality Date  . No past surgeries     Family History: family history includes Diabetes in her sister; Hypertension in her sister. Social History:  reports that she has never smoked. She has never used smokeless tobacco. She reports that she does not drink alcohol or use illicit drugs.   Prenatal Transfer Tool  Maternal Diabetes: No Genetic Screening: Normal Maternal Ultrasounds/Referrals: Normal Fetal Ultrasounds or other Referrals:  None Maternal Substance Abuse:  No Significant Maternal Medications:  Meds include: Zoloft Significant Maternal Lab Results:  None Other Comments:  None  ROS  Dilation: 7 Effacement (%): 100 Station: -1 Exam by:: Bertram Millard, RN Blood pressure 104/62, pulse 82, temperature 98.4 F (36.9 C), temperature source Oral, resp. rate 20, height 5\' 7"  (1.702 m), weight 246 lb (111.585 kg), last menstrual period 12/14/2011, SpO2 97.00%. Maternal Exam:  Uterine Assessment: Contraction strength is moderate.  Contraction frequency is regular.   Abdomen: Patient reports no abdominal tenderness. Fundal height is term FH, FHR 142.   Estimated fetal weight is AGA.   Fetal presentation: vertex  Introitus: Normal vulva. Normal vagina.  Amniotic fluid character:  clear.  Pelvis: adequate for delivery.   Cervix: Cervix evaluated by digital exam.     Physical Exam  Constitutional: She is oriented to person, place, and time. She appears well-developed and well-nourished.  HENT:  Head: Normocephalic and atraumatic.  Neck: Normal range of motion. Neck supple.  Cardiovascular: Normal rate and regular rhythm.   Respiratory: Effort normal and breath sounds normal.  GI:  Term FH, FHR 142  Genitourinary:  Now 9/C/+1>>>AROM>>clr AF  Musculoskeletal: Normal range of motion.  Neurological: She is alert and oriented to person, place, and time.    Prenatal labs: ABO, Rh: --/--/B POS, B POS (09/06 0011) Antibody: NEG (09/06 0011) Rubella: Immune (01/31 0000) RPR: NON REACTIVE (09/06 0011)  HBsAg: Negative (01/31 0000)  HIV: Non-reactive (01/31 0000)  GBS: Negative (08/29 0000)   Assessment/Plan: Term preg, labor   Maria Schaefer 09/16/2012, 7:17 AM

## 2012-09-17 LAB — CBC
Hemoglobin: 11.2 g/dL — ABNORMAL LOW (ref 12.0–15.0)
MCV: 85.5 fL (ref 78.0–100.0)
Platelets: 270 10*3/uL (ref 150–400)
RBC: 3.94 MIL/uL (ref 3.87–5.11)
WBC: 14.9 10*3/uL — ABNORMAL HIGH (ref 4.0–10.5)

## 2012-09-17 MED ORDER — IBUPROFEN 800 MG PO TABS: 800.0000 mg | ORAL_TABLET | Freq: Three times a day (TID) | ORAL | Status: DC | PRN

## 2012-09-17 MED ORDER — HYDROCODONE-ACETAMINOPHEN 5-500 MG PO TABS: 1.0000 | ORAL_TABLET | Freq: Every day | ORAL | Status: DC | PRN

## 2012-09-17 NOTE — Progress Notes (Signed)
Post Partum Day 1 Subjective: no complaints  Objective: Blood pressure 131/70, pulse 91, temperature 98.1 F (36.7 C), temperature source Oral, resp. rate 19, height 5\' 7"  (1.702 m), weight 246 lb (111.585 kg), last menstrual period 12/14/2011, SpO2 99.00%, unknown if currently breastfeeding.  Physical Exam:  General: alert Lochia: appropriate Uterine Fundus: firm Incision: healing well DVT Evaluation: No evidence of DVT seen on physical exam.   Recent Labs  09/16/12 0011 09/17/12 0636  HGB 11.3* 11.2*  HCT 33.3* 33.7*    Assessment/Plan: Discharge home   LOS: 2 days   Maria Schaefer M 09/17/2012, 11:03 AM

## 2012-09-17 NOTE — Discharge Summary (Signed)
Obstetric Discharge Summary Reason for Admission: onset of labor Prenatal Procedures: none Intrapartum Procedures: spontaneous vaginal delivery Postpartum Procedures: none Complications-Operative and Postpartum: none Hemoglobin  Date Value Range Status  09/17/2012 11.2* 12.0 - 15.0 g/dL Final     HCT  Date Value Range Status  09/17/2012 33.7* 36.0 - 46.0 % Final    Physical Exam:  General: alert Lochia: appropriate Uterine Fundus: firm Incision: healing well DVT Evaluation: No evidence of DVT seen on physical exam.  Discharge Diagnoses: Term Pregnancy-delivered  Discharge Information: Date: 09/17/2012 Activity: pelvic rest Diet: routine Medications: PNV, Ibuprofen and Percocet Condition: stable Instructions: refer to practice specific booklet Discharge to: home Follow-up Information   Follow up with Meriel Pica, MD. Call in 6 weeks.   Specialty:  Obstetrics and Gynecology   Contact information:   [address] [place] [address] [phone]       Newborn Data: Live born female  Birth Weight: 8 lb 4.1 oz (3745 g) APGAR: 8, 9  Home with mother.  Maria Schaefer M 09/17/2012, 11:06 AM

## 2012-09-17 NOTE — Anesthesia Postprocedure Evaluation (Signed)
Anesthesia Post Note  Patient: Maria Schaefer  Procedure(s) Performed: * No procedures listed *  Anesthesia type: Epidural  Patient location: Mother/Baby  Post pain: Pain level controlled  Post assessment: Post-op Vital signs reviewed  Last Vitals:  Filed Vitals:   09/17/12 0557  BP: 131/70  Pulse: 91  Temp: 36.7 C  Resp: 19    Post vital signs: Reviewed  Level of consciousness:alert  Complications: No apparent anesthesia complications

## 2012-09-17 NOTE — Progress Notes (Signed)

## 2012-09-18 ENCOUNTER — Ambulatory Visit: Payer: Self-pay

## 2012-09-18 ENCOUNTER — Inpatient Hospital Stay (HOSPITAL_COMMUNITY): Admission: RE | Admit: 2012-09-18 | Payer: PRIVATE HEALTH INSURANCE | Source: Ambulatory Visit

## 2012-09-18 NOTE — Lactation Note (Signed)
This note was copied from the chart of [facility]. Lactation Consultation Note  Patient Name: Maria Janeshia Ciliberto QIONG'E Date: 09/18/2012  Mother has been feeding baby at the breast with the shield and giving formula by bottle at her request. Breast are soft, scant colostrum hand expressed and nipples are tender. Left nipple is flat and will erect with pre-pumping. Mother applying a #20 Nipple shield when she latches her baby. She has increased formula over breast feeding attempts. She can apply her nipple shield independently and can report evidence of a good latch and signs of milk transfer. Assisted mother with latch, baby did well and in a rhythmic pattern of sucking when I left the room. Mother was given comfort gels for sore nipples and flange size was increased to 27mm for comfort. Mother has a breast pump at home and plans to pump and give EBM once in as well. Called back to room. Small amount of blood noted in the shield and mother mother said baby pinched and twisted his mouth causing increase nipple pain. [facility]. Mother wants to "keep working on things" and will call if needs assistance. Informed of Lactation support and service post  D/C.    Maternal Data    Feeding    LATCH Score/Interventions                      Lactation Tools Discussed/Used     Consult Status      Omar Person 09/18/2012, 7:19 PM

## 2012-09-18 NOTE — Progress Notes (Signed)
UR chart review completed.  

## 2012-09-18 NOTE — Progress Notes (Signed)
Post Partum Day 2 Subjective: no complaints, up ad lib, voiding, tolerating PO and + flatus. Patient had planned to go home yesterday. BAby was running low grade temp as well as she was experiencing difficulty with breastfeeding. Baby is afebrile this morning.   Objective: Blood pressure 130/57, pulse 73, temperature 97.9 F (36.6 C), temperature source Oral, resp. rate 17, height 5\' 7"  (1.702 m), weight 246 lb (111.585 kg), last menstrual period 12/14/2011, SpO2 98.00%, unknown if currently breastfeeding.  Physical Exam:  General: alert and cooperative Lochia: appropriate Uterine Fundus: firm Incision: perineum intact DVT Evaluation: No evidence of DVT seen on physical exam. Negative Homan's sign. No cords or calf tenderness. No significant calf/ankle edema.   Recent Labs  09/16/12 0011 09/17/12 0636  HGB 11.3* 11.2*  HCT 33.3* 33.7*    Assessment/Plan: Discharge home   LOS: 3 days   CURTIS,CAROL G 09/18/2012, 8:20 AM

## 2013-01-16 ENCOUNTER — Other Ambulatory Visit: Payer: Self-pay | Admitting: Gastroenterology

## 2013-01-16 DIAGNOSIS — R1013 Epigastric pain: Secondary | ICD-10-CM

## 2013-01-16 DIAGNOSIS — R110 Nausea: Secondary | ICD-10-CM

## 2013-01-29 ENCOUNTER — Ambulatory Visit (HOSPITAL_COMMUNITY): Payer: 59

## 2013-02-14 ENCOUNTER — Ambulatory Visit (HOSPITAL_COMMUNITY)
Admission: RE | Admit: 2013-02-14 | Discharge: 2013-02-14 | Disposition: A | Discharge status: Home / Self Care | Payer: 59 | Source: Ambulatory Visit | Attending: Gastroenterology | Admitting: Gastroenterology

## 2013-02-14 ENCOUNTER — Encounter (HOSPITAL_COMMUNITY)
Admission: RE | Admit: 2013-02-14 | Discharge: 2013-02-14 | Disposition: A | Discharge status: Home / Self Care | Payer: 59 | Source: Ambulatory Visit | Attending: Gastroenterology | Admitting: Gastroenterology

## 2013-02-14 DIAGNOSIS — R1013 Epigastric pain: Secondary | ICD-10-CM

## 2013-02-14 DIAGNOSIS — R110 Nausea: Secondary | ICD-10-CM

## 2013-02-14 DIAGNOSIS — D1803 Hemangioma of intra-abdominal structures: Secondary | ICD-10-CM | POA: Insufficient documentation

## 2013-02-14 DIAGNOSIS — Q619 Cystic kidney disease, unspecified: Secondary | ICD-10-CM | POA: Insufficient documentation

## 2013-02-14 DIAGNOSIS — R1011 Right upper quadrant pain: Secondary | ICD-10-CM | POA: Insufficient documentation

## 2013-02-14 MED ORDER — TECHNETIUM TC 99M MEBROFENIN IV KIT
5.0000 | PACK | Freq: Once | INTRAVENOUS | Status: AC | PRN
  Administered 2013-02-14: 5 via INTRAVENOUS

## 2013-02-14 MED ORDER — SINCALIDE 5 MCG IJ SOLR
INTRAMUSCULAR | Status: AC
  Administered 2013-02-14: 1.9 ug
  Filled 2013-02-14: qty 5

## 2013-02-14 MED ORDER — STERILE WATER FOR INJECTION IJ SOLN
INTRAMUSCULAR | Status: AC
  Filled 2013-02-14: qty 10

## 2013-03-27 ENCOUNTER — Emergency Department (HOSPITAL_COMMUNITY)
Admission: EM | Admit: 2013-03-27 | Discharge: 2013-03-28 | Disposition: A | Discharge status: Home / Self Care | Payer: 59 | Attending: Emergency Medicine | Admitting: Emergency Medicine

## 2013-03-27 DIAGNOSIS — Z3202 Encounter for pregnancy test, result negative: Secondary | ICD-10-CM | POA: Diagnosis not present

## 2013-03-27 DIAGNOSIS — T43591A Poisoning by other antipsychotics and neuroleptics, accidental (unintentional), initial encounter: Secondary | ICD-10-CM | POA: Diagnosis not present

## 2013-03-27 DIAGNOSIS — F329 Major depressive disorder, single episode, unspecified: Secondary | ICD-10-CM | POA: Diagnosis present

## 2013-03-27 DIAGNOSIS — T4272XA Poisoning by unspecified antiepileptic and sedative-hypnotic drugs, intentional self-harm, initial encounter: Secondary | ICD-10-CM | POA: Diagnosis not present

## 2013-03-27 DIAGNOSIS — T438X2A Poisoning by other psychotropic drugs, intentional self-harm, initial encounter: Secondary | ICD-10-CM

## 2013-03-27 DIAGNOSIS — F32A Depression, unspecified: Secondary | ICD-10-CM | POA: Diagnosis present

## 2013-03-27 DIAGNOSIS — T4271XA Poisoning by unspecified antiepileptic and sedative-hypnotic drugs, accidental (unintentional), initial encounter: Secondary | ICD-10-CM | POA: Insufficient documentation

## 2013-03-27 DIAGNOSIS — Z8619 Personal history of other infectious and parasitic diseases: Secondary | ICD-10-CM | POA: Diagnosis not present

## 2013-03-27 DIAGNOSIS — T426X1A Poisoning by other antiepileptic and sedative-hypnotic drugs, accidental (unintentional), initial encounter: Secondary | ICD-10-CM | POA: Diagnosis not present

## 2013-03-27 DIAGNOSIS — T426X2A Poisoning by other antiepileptic and sedative-hypnotic drugs, intentional self-harm, initial encounter: Secondary | ICD-10-CM | POA: Insufficient documentation

## 2013-03-27 DIAGNOSIS — T50901A Poisoning by unspecified drugs, medicaments and biological substances, accidental (unintentional), initial encounter: Secondary | ICD-10-CM | POA: Diagnosis present

## 2013-03-27 DIAGNOSIS — T43502A Poisoning by unspecified antipsychotics and neuroleptics, intentional self-harm, initial encounter: Secondary | ICD-10-CM | POA: Diagnosis not present

## 2013-03-27 DIAGNOSIS — T50902A Poisoning by unspecified drugs, medicaments and biological substances, intentional self-harm, initial encounter: Secondary | ICD-10-CM

## 2013-03-27 DIAGNOSIS — Z8659 Personal history of other mental and behavioral disorders: Secondary | ICD-10-CM | POA: Diagnosis not present

## 2013-03-27 DIAGNOSIS — Z8679 Personal history of other diseases of the circulatory system: Secondary | ICD-10-CM | POA: Diagnosis not present

## 2013-03-27 DIAGNOSIS — Z008 Encounter for other general examination: Secondary | ICD-10-CM | POA: Diagnosis present

## 2013-03-27 LAB — RAPID URINE DRUG SCREEN, HOSP PERFORMED
Amphetamines: NOT DETECTED
BARBITURATES: NOT DETECTED
Benzodiazepines: NOT DETECTED
Cocaine: NOT DETECTED
OPIATES: NOT DETECTED
TETRAHYDROCANNABINOL: NOT DETECTED

## 2013-03-27 LAB — CBC WITH DIFFERENTIAL/PLATELET
BASOS ABS: 0 10*3/uL (ref 0.0–0.1)
BASOS PCT: 0 % (ref 0–1)
EOS PCT: 0 % (ref 0–5)
Eosinophils Absolute: 0 10*3/uL (ref 0.0–0.7)
HEMATOCRIT: 37.2 % (ref 36.0–46.0)
Hemoglobin: 12.8 g/dL (ref 12.0–15.0)
LYMPHS PCT: 22 % (ref 12–46)
Lymphs Abs: 1.7 10*3/uL (ref 0.7–4.0)
MCH: 30.1 pg (ref 26.0–34.0)
MCHC: 34.4 g/dL (ref 30.0–36.0)
MCV: 87.5 fL (ref 78.0–100.0)
MONO ABS: 0.3 10*3/uL (ref 0.1–1.0)
Monocytes Relative: 3 % (ref 3–12)
Neutro Abs: 5.9 10*3/uL (ref 1.7–7.7)
Neutrophils Relative %: 74 % (ref 43–77)
PLATELETS: 393 10*3/uL (ref 150–400)
RBC: 4.25 MIL/uL (ref 3.87–5.11)
RDW: 12.4 % (ref 11.5–15.5)
WBC: 8 10*3/uL (ref 4.0–10.5)

## 2013-03-27 LAB — URINALYSIS, ROUTINE W REFLEX MICROSCOPIC
Bilirubin Urine: NEGATIVE
GLUCOSE, UA: NEGATIVE mg/dL
KETONES UR: 15 mg/dL — AB
NITRITE: NEGATIVE
PH: 5.5 (ref 5.0–8.0)
Protein, ur: NEGATIVE mg/dL
SPECIFIC GRAVITY, URINE: 1.021 (ref 1.005–1.030)
Urobilinogen, UA: 0.2 mg/dL (ref 0.0–1.0)

## 2013-03-27 LAB — URINE MICROSCOPIC-ADD ON

## 2013-03-27 LAB — PROTIME-INR
INR: 0.96 (ref 0.00–1.49)
Prothrombin Time: 12.6 seconds (ref 11.6–15.2)

## 2013-03-27 LAB — COMPREHENSIVE METABOLIC PANEL
ALBUMIN: 3.7 g/dL (ref 3.5–5.2)
ALT: 11 U/L (ref 0–35)
AST: 13 U/L (ref 0–37)
Alkaline Phosphatase: 101 U/L (ref 39–117)
BUN: 6 mg/dL (ref 6–23)
CALCIUM: 9.6 mg/dL (ref 8.4–10.5)
CO2: 24 mEq/L (ref 19–32)
CREATININE: 0.81 mg/dL (ref 0.50–1.10)
Chloride: 104 mEq/L (ref 96–112)
GFR calc Af Amer: >90 mL/min (ref >90)
GFR calc non Af Amer: >90 mL/min (ref >90)
Glucose, Bld: 91 mg/dL (ref 70–99)
Potassium: 3.5 mEq/L — ABNORMAL LOW (ref 3.7–5.3)
SODIUM: 141 meq/L (ref 137–147)
TOTAL PROTEIN: 7.2 g/dL (ref 6.0–8.3)
Total Bilirubin: 0.7 mg/dL (ref 0.3–1.2)

## 2013-03-27 LAB — ACETAMINOPHEN LEVEL: Acetaminophen (Tylenol), Serum: <15 ug/mL (ref 10–30)

## 2013-03-27 LAB — ETHANOL: Alcohol, Ethyl (B): <11 mg/dL (ref 0–11)

## 2013-03-27 LAB — SALICYLATE LEVEL
Salicylate Lvl: <2 mg/dL — ABNORMAL LOW (ref 2.8–20.0)
Salicylate Lvl: <2 mg/dL — ABNORMAL LOW (ref 2.8–20.0)

## 2013-03-27 LAB — PREGNANCY, URINE: PREG TEST UR: NEGATIVE

## 2013-03-27 MED ORDER — SODIUM CHLORIDE 0.9 % IV SOLN
INTRAVENOUS | Status: DC
  Administered 2013-03-27: 19:00:00 via INTRAVENOUS

## 2013-03-27 NOTE — ED Notes (Addendum)
Per EMs. Pt told friend she overdosed on her home medications. Pt took 4 of her vistaril, 3 ambiens and one phenergan. Pt drowsy with EMS. Pt O2 sat 100% on RA.

## 2013-03-27 NOTE — ED Provider Notes (Signed)
CSN: [id number]     Arrival date & time 03/27/13  1817 History   First MD Initiated Contact with Patient 03/27/13 1821     Chief Complaint  Patient presents with  . Drug Overdose  . Medical Clearance    Patient is a 38 y.o. female presenting with Overdose. The history is provided by the patient and the EMS personnel. The history is limited by the condition of the patient (AMS, lethargic).  Drug Overdose  Pt was seen at 1845. Per EMS, pt's friend and pt: pt s/p OD on several of her home medications. States she took 4 vistaril, 1 phenergan and 3 Ambien tablets today. Pt initially stated she "didn't know" when she took the meds, then stated "at 4pm."  Pt's friend states pt told her that she took the meds in [place]. On arrival to the ED, pt states she "needed to sleep." Pt refuses to speak further with me.    Past Medical History  Diagnosis Date  . Insomnia   . Hx of varicella   . Dysrhythmia     PVSs  . Anxiety   . Depression     tried to harm herself once  . Intermittent low back pain   . AMA (advanced maternal age) multigravida 35+    Past Surgical History  Procedure Laterality Date  . No past surgeries     Family History  Problem Relation Age of Onset  . Hypertension Sister   . Diabetes Sister    History  Substance Use Topics  . Smoking status: Never Smoker   . Smokeless tobacco: Never Used  . Alcohol Use: No   OB History   Grav Para Term Preterm Abortions TAB SAB Ect Mult Living   2 1 1  1  1   1      Review of Systems  Unable to perform ROS: Mental status change      Allergies  Review of patient's allergies indicates no known allergies.  Home Medications   Current Outpatient Rx  Name  Route  Sig  Dispense  Refill  . HYDROcodone-acetaminophen (VICODIN) 5-500 MG per tablet   Oral   Take 1 tablet by mouth daily as needed. Severe pain   30 tablet   0   . ibuprofen (ADVIL,MOTRIN) 800 MG tablet   Oral   Take 1 tablet (800 mg total) by mouth every 8 (eight)  hours as needed. Pain   30 tablet   0    BP 122/70  Pulse 107  Temp(Src) 98.9 F (37.2 C) (Oral)  Resp 17  SpO2 96% Physical Exam 1850: Physical examination:  Nursing notes reviewed; Vital signs and O2 SAT reviewed;  Constitutional: Well developed, Well nourished, Well hydrated, In no acute distress; Head:  Normocephalic, atraumatic; Eyes: EOMI, PERRL, No scleral icterus; ENMT: Mouth and pharynx normal, Mucous membranes moist; Neck: Supple, Full range of motion, No lymphadenopathy; Cardiovascular: Regular rate and rhythm, No gallop; Respiratory: Breath sounds clear & equal bilaterally, No rales, rhonchi, wheezes.  Speaking full sentences with ease, Normal respiratory effort/excursion; Chest: Nontender, Movement normal; Abdomen: Soft, Nontender, Nondistended, Normal bowel sounds; Genitourinary: No CVA tenderness; Extremities: Pulses normal, No tenderness, No edema, No calf edema or asymmetry.; Neuro: Lethargic, but opens her eyes to her name. Speech slurred. No facial droop. Moves all extremities on stretcher spontaneously and to command without apparent gross focal motor deficits.; Skin: Color normal, Warm, Dry.; Psych:  Affect flat, poor eye contact.   ED Course  Procedures  EKG Interpretation   Date/Time:  Tuesday March 27 2013 18:41:50 EDT Ventricular Rate:  110 PR Interval:  156 QRS Duration: 82 QT Interval:  345 QTC Calculation: 467 R Axis:   47 Text Interpretation:  Sinus tachycardia Baseline wander When compared with  ECG of 07/08/2011 First degree A-V block is no longer Present Confirmed by  [facility]  MD, Nicholos JohnsKATHLEEN [phone]([id number]) on 03/27/2013 7:00:30 PM      MDM  MDM Reviewed: previous chart, nursing note and vitals Reviewed previous: labs Interpretation: labs and x-ray    Results for orders placed during the hospital encounter of 03/27/13  ACETAMINOPHEN LEVEL      Result Value Ref Range   Acetaminophen (Tylenol), Serum <15.0  10 - 30 ug/mL  ETHANOL      Result  Value Ref Range   Alcohol, Ethyl (B) <11  0 - 11 mg/dL  SALICYLATE LEVEL      Result Value Ref Range   Salicylate Lvl <2.0 (*) 2.8 - 20.0 mg/dL  URINE RAPID DRUG SCREEN (HOSP PERFORMED)      Result Value Ref Range   Opiates NONE DETECTED  NONE DETECTED   Cocaine NONE DETECTED  NONE DETECTED   Benzodiazepines NONE DETECTED  NONE DETECTED   Amphetamines NONE DETECTED  NONE DETECTED   Tetrahydrocannabinol NONE DETECTED  NONE DETECTED   Barbiturates NONE DETECTED  NONE DETECTED  CBC WITH DIFFERENTIAL      Result Value Ref Range   WBC 8.0  4.0 - 10.5 K/uL   RBC 4.25  3.87 - 5.11 MIL/uL   Hemoglobin 12.8  12.0 - 15.0 g/dL   HCT 47.837.2  29.536.0 - 62.146.0 %   MCV 87.5  78.0 - 100.0 fL   MCH 30.1  26.0 - 34.0 pg   MCHC 34.4  30.0 - 36.0 g/dL   RDW 30.812.4  65.711.5 - 84.615.5 %   Platelets 393  150 - 400 K/uL   Neutrophils Relative % 74  43 - 77 %   Neutro Abs 5.9  1.7 - 7.7 K/uL   Lymphocytes Relative 22  12 - 46 %   Lymphs Abs 1.7  0.7 - 4.0 K/uL   Monocytes Relative 3  3 - 12 %   Monocytes Absolute 0.3  0.1 - 1.0 K/uL   Eosinophils Relative 0  0 - 5 %   Eosinophils Absolute 0.0  0.0 - 0.7 K/uL   Basophils Relative 0  0 - 1 %   Basophils Absolute 0.0  0.0 - 0.1 K/uL  COMPREHENSIVE METABOLIC PANEL      Result Value Ref Range   Sodium 141  137 - 147 mEq/L   Potassium 3.5 (*) 3.7 - 5.3 mEq/L   Chloride 104  96 - 112 mEq/L   CO2 24  19 - 32 mEq/L   Glucose, Bld 91  70 - 99 mg/dL   BUN 6  6 - 23 mg/dL   Creatinine, Ser 9.620.81  0.50 - 1.10 mg/dL   Calcium 9.6  8.4 - 95.210.5 mg/dL   Total Protein 7.2  6.0 - 8.3 g/dL   Albumin 3.7  3.5 - 5.2 g/dL   AST 13  0 - 37 U/L   ALT 11  0 - 35 U/L   Alkaline Phosphatase 101  39 - 117 U/L   Total Bilirubin 0.7  0.3 - 1.2 mg/dL   GFR calc non Af Amer >90  >90 mL/min   GFR calc Af Amer >90  >90 mL/min  PROTIME-INR  Result Value Ref Range   Prothrombin Time 12.6  11.6 - 15.2 seconds   INR 0.96  0.00 - 1.49  URINALYSIS, ROUTINE W REFLEX MICROSCOPIC       Result Value Ref Range   Color, Urine YELLOW  YELLOW   APPearance CLOUDY (*) CLEAR   Specific Gravity, Urine 1.021  1.005 - 1.030   pH 5.5  5.0 - 8.0   Glucose, UA NEGATIVE  NEGATIVE mg/dL   Hgb urine dipstick LARGE (*) NEGATIVE   Bilirubin Urine NEGATIVE  NEGATIVE   Ketones, ur 15 (*) NEGATIVE mg/dL   Protein, ur NEGATIVE  NEGATIVE mg/dL   Urobilinogen, UA 0.2  0.0 - 1.0 mg/dL   Nitrite NEGATIVE  NEGATIVE   Leukocytes, UA TRACE (*) NEGATIVE  PREGNANCY, URINE      Result Value Ref Range   Preg Test, Ur NEGATIVE  NEGATIVE  SALICYLATE LEVEL      Result Value Ref Range   Salicylate Lvl <3.8 (*) 2.8 - 20.0 mg/dL  ACETAMINOPHEN LEVEL      Result Value Ref Range   Acetaminophen (Tylenol), Serum <15.0  10 - 30 ug/mL  URINE MICROSCOPIC-ADD ON      Result Value Ref Range   Squamous Epithelial / LPF MANY (*) RARE   WBC, UA 0-2  <3 WBC/hpf   RBC / HPF 7-10  <3 RBC/hpf   Bacteria, UA RARE  RARE    2345:  Pt now more awake/alert. Continues to refuse to speak with me regarding events PTA. VS remain stable, resps easy, neuro non-focal. TTS eval pending.     Alfonzo Feller, DO 03/27/13 2354

## 2013-03-27 NOTE — ED Notes (Signed)
Bed: DP82 Expected date:  Expected time:  Means of arrival:  Comments: OD, anxiety

## 2013-03-28 ENCOUNTER — Encounter (HOSPITAL_COMMUNITY): Payer: Self-pay | Admitting: Registered Nurse

## 2013-03-28 DIAGNOSIS — F32A Depression, unspecified: Secondary | ICD-10-CM | POA: Diagnosis present

## 2013-03-28 DIAGNOSIS — T50901A Poisoning by unspecified drugs, medicaments and biological substances, accidental (unintentional), initial encounter: Secondary | ICD-10-CM | POA: Diagnosis present

## 2013-03-28 DIAGNOSIS — F329 Major depressive disorder, single episode, unspecified: Secondary | ICD-10-CM | POA: Diagnosis present

## 2013-03-28 MED ORDER — ZOLPIDEM TARTRATE 5 MG PO TABS: 5.0000 mg | ORAL_TABLET | Freq: Every evening | ORAL | Status: DC | PRN

## 2013-03-28 MED ORDER — IBUPROFEN 200 MG PO TABS: 600.0000 mg | ORAL_TABLET | Freq: Three times a day (TID) | ORAL | Status: DC | PRN

## 2013-03-28 MED ORDER — SERTRALINE HCL 50 MG PO TABS: 100.0000 mg | ORAL_TABLET | Freq: Every day | ORAL | Status: DC

## 2013-03-28 MED ORDER — SERTRALINE HCL 100 MG PO TABS: 100.0000 mg | ORAL_TABLET | Freq: Every day | ORAL | Status: DC

## 2013-03-28 MED ORDER — ALUM & MAG HYDROXIDE-SIMETH 200-200-20 MG/5ML PO SUSP: 30.0000 mL | ORAL | Status: DC | PRN

## 2013-03-28 MED ORDER — HYDROXYZINE HCL 25 MG PO TABS: 25.0000 mg | ORAL_TABLET | Freq: Four times a day (QID) | ORAL | Status: DC | PRN

## 2013-03-28 MED ORDER — ONDANSETRON HCL 4 MG PO TABS: 4.0000 mg | ORAL_TABLET | Freq: Three times a day (TID) | ORAL | Status: DC | PRN

## 2013-03-28 MED ORDER — ACETAMINOPHEN 325 MG PO TABS: 650.0000 mg | ORAL_TABLET | ORAL | Status: DC | PRN

## 2013-03-28 MED ORDER — HYDROXYZINE HCL 25 MG PO TABS: 25.0000 mg | ORAL_TABLET | Freq: Three times a day (TID) | ORAL | Status: DC | PRN

## 2013-03-28 NOTE — Discharge Instructions (Signed)
Overdose, Adult  A person can overdose on alcohol, drugs or both by accident or on purpose. If it was on purpose, it is a serious matter. Professional help should be sought. If the overdose was an accident, certain steps should be taken to make sure that it never happens again.  ACCIDENTAL OVERDOSE  Overdosing on prescription medications can be a result of:  · Not understanding the instructions.  · Misreading the label.  · Forgetting that you took a dose and then taking another by mistake. This situation happens a lot.  To make sure this does not happen again:  · Clarify the correct dosage with your caregiver.  · Place the correct dosage in a "pill-minder" container (labeled for each day and time of day).  · Have someone dispense your medicine.  Please be sure to follow-up with your primary care doctor as directed.  INTENTIONAL OVERDOSE  If the overdose was on purpose, it is a serious situation. Taking more than the prescribed amount of medications (including taking someone else's prescription), abusing street drugs or drinking an amount of alcohol that requires medical treatment can show a variety of possible problems. These may indicate you:  · Are depressed or suicidal.  · Are abusing drugs, took too much or combined different drugs to experiment with the effects.  · Mixed alcohol with drugs and did not realize the danger of doing so (this is drug abuse).  · Are suffering addiction to drugs and/or alcohol (also known as chemical dependency).  · Binge drink.  If you have not been referred to a mental health professional for help, it is important that you get help right away. Only a professional can determine which problems may exist and what the best course of treatment may be. It is your responsibility to follow-up with further evaluation or treatment as directed.   Alcohol is responsible for a large number of overdoses and unintended deaths among college-age young adults. Binge drinking is consuming 4-5 drinks  in a short period of time. The amount of alcohol in standard servings of wine (5 oz.), beer (12 oz.) and distilled spirits (1.5 oz., 80 proof) is the same. Beer or wine can be just as dangerous to the binge drinker as "hard" liquor can be.   CONSEQUENCES OF BINGE DRINKING  Alcohol poisoning is the most serious consequence of binge drinking. This is a severe and potentially fatal physical reaction to an alcohol overdose. When too much alcohol is consumed, the brain does not get enough oxygen. The lack of oxygen will eventually cause the brain to shut down the voluntary functions that regulate breathing and heart rate. Symptoms of alcohol poisoning include:  · Vomiting.  · Passing out (unconsciousness).  · Cold, clammy, pale or bluish skin.  · Slow or irregular breathing.  WHAT SHOULD I DO NEXT?  If you have a history of drug abuse or suffer chemical dependency (alcoholism, drug addiction or both), you might consider the following:  · Talk with a qualified substance abuse counselor and consider entering a treatment program.  · Go to a detox facility if necessary.  · If you were attending self-help group meetings, consider returning to them and go often.  · Explore other resources located near you (see sources listed below).  If you are unsure if you have a substance abuse problem, ask yourself the following questions:  · Have you been told by friends or family that drugs/alcohol has become a problem?  · Do you get into fights   when drinking or using drugs?  · Do you have blackouts (not remembering what you do while using)?  · Do you lie about use or amounts of drugs or alcohol you consume?  · Do you need chemicals to get you going?  · Do you suffer in work or school performance because of drug or alcohol use?  · Do you get sick from drug or alcohol use but continue to use anyway?  · Do you need drugs or alcohol to relate to people or feel comfortable in social situations?  · Do you use drugs or alcohol to forget  problems?  If you answered "Yes" to any of the above questions, it means you show signs of chemical dependency and a professional evaluation is suggested. The longer the use of drugs and alcohol continues, the problems will become greater.  SEEK IMMEDIATE MEDICAL CARE IF:   · You feel like you might repeat your problematic behavior.  · You need someone to talk to and feel that it should not wait.  · You feel you are a danger to yourself or someone else.  · You feel like you are having a new reaction to medications you are taking, or you are getting worse after leaving a care center.  · You have an overwhelming urge to drink or use drugs.  Addiction cannot be cured, but it can be treated successfully. Treatment centers are listed in the yellow pages under: Cocaine, Narcotics, and Alcoholics Anonymous. Most hospitals and clinics can refer you to a specialized care center. The [place] government maintains a toll-free number for obtaining treatment referrals: 1-800-662-4357 or 1-800-487-4889 (TDD) and maintains a website: http://findtreatment.samhsa.gov. Other websites for additional information are: www.mentalhealth.samhsa.gov. and www.nida.gov.  In [place] treatment resources are listed in each Province with listings available under [employer] or similar titles.  Document Released: 12/31/2002 Document Revised: 03/22/2011 Document Reviewed: 11/22/2007  ExitCare® Patient Information ©2014 [employer].

## 2013-03-28 NOTE — BH Assessment (Signed)
Tele Assessment Note   Maria Schaefer is an 38 y.o. female who presents by EMS after her friend suspected she had made a suicide attempt.  Maria Schaefer reports that she was feeling a little sick so she took several phenergan and vistiril in an attempt to get some rest.  She states that her friend knows that she gets down sometimes and may have been worried about her.  She reports she woke up with the police knocking down her door because her friend must have thought she took some sleeping pills.  Earlier in her ED visit, Maria Schaefer admitted to taking, 4 vistaril, 1 phenergan and 3 Ambien.  She denies that she was attempting suicide today, but states she has SI "once in a blue moon."  She states she doesn't think that has been within the last six months.  When asked if she ever thinks of a plan during those moments she stated, "somtes something simple like sleeping pills.  So when I wake up God sees me as a person  I don't think of it as suicide though just closing your eyes at hat horrible painful day and when you open your eyes it's all brand new."  She is somewhat drowsy, but speaks almost romantically of this image.  She reports that she's been down lately and that she has multiple stressors including bills and that she recently caught her guy friend cheating on her-this man is the father of her 70 old son, so she is going through court to set up shared custody for him, but that's expensive.  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  She reports one previous overdose, on Ambien, in 2012.  She states she was not hospitalized beyond her ED visit.  She currently sees [facility] at [facility] and has an appointment on Monday, but is going to see [facility] at [facility] on Thursday because she's thinking of switching to their practice because they incorporate faith into their counseling.  She denies HI, SA, or AVH.  This Probation officer consulted with EDP, Maria Schaefer, who agrees the  patient will benefit from waiting in the Emergency Department until morning when she will see the psychiatrist due to her intentional overdose and romanticization of suicide, but denial of current ideation or intent.  This Probation officer also consulted with [facility] NP, Maria [facility].      Axis I: Major Depression, Recurrent severe Axis II: Deferred Axis III:  Past Medical History  Diagnosis Date  . Insomnia   . Hx of varicella   . Dysrhythmia     PVSs  . Anxiety   . Depression     tried to harm herself once  . Intermittent low back pain   . AMA (advanced maternal age) multigravida 35+    Axis IV: economic problems, problems related to legal system/crime and problems with primary support group Axis V: 41-50 serious symptoms  Past Medical History:  Past Medical History  Diagnosis Date  . Insomnia   . Hx of varicella   . Dysrhythmia     PVSs  . Anxiety   . Depression     tried to harm herself once  . Intermittent low back pain   . AMA (advanced maternal age) multigravida 35+     Past Surgical History  Procedure Laterality Date  . No past surgeries      Family History:  Family History  Problem Relation Age of Onset  .  Hypertension Sister   . Diabetes Sister     Social History:  reports that she has never smoked. She has never used smokeless tobacco. She reports that she does not drink alcohol or use illicit drugs.  Additional Social History:  Alcohol / Drug Use History of alcohol / drug use?: No history of alcohol / drug abuse  CIWA: CIWA-Ar BP: 127/87 mmHg Pulse Rate: 68 COWS:    Allergies: No Known Allergies  Home Medications:  (Not in a hospital admission)  OB/GYN Status:  No LMP recorded.  General Assessment Data Location of Assessment: [facility] ED Is this a Tele or Face-to-Face Assessment?: Tele Assessment Is this an Initial Assessment or a Re-assessment for this encounter?: Initial Assessment Living Arrangements: Alone (shares custody of 6 mo son) Can pt return to  current living arrangement?: Yes Admission Status: Voluntary Is patient capable of signing voluntary admission?: Yes Transfer from: [facility] Referral Source:  (EMS)     [facility] Living Arrangements: Alone (shares custody of 6 mo son) Name of Therapist: [facility]  Education Status Is patient currently in school?: No Highest grade of school patient has completed: College  Risk to self Suicidal Ideation: No-Not Currently/Within Last 6 Months Suicidal Intent: No Is patient at risk for suicide?: Yes Suicidal Plan?: No-Not Currently/Within Last 6 Months (thoughts of overdosing) Access to Means: Yes Specify Access to Suicidal Means: Rx medications What has been your use of drugs/alcohol within the last 12 months?: denies Previous Attempts/Gestures: Yes How many times?: 1 Triggers for Past Attempts: Other personal contacts Intentional Self Injurious Behavior: None Family Suicide History: No Recent stressful Scha[facility] event(s): Legal Issues;Turmoil (Comment) (boyfriend cheated, custody issues for 41 month old son) Persecutory voices/beliefs?: No Depression: Yes Depression Symptoms: Despondent;Feeling worthless/self pity;Loss of interest in usual pleasures;Guilt;Fatigue;Tearfulness;Insomnia Substance abuse history and/or treatment for substance abuse?: No Suicide prevention information given to non-admitted patients: Not applicable  Risk to Others Homicidal Ideation: No Thoughts of Harm to Others: No Current Homicidal Intent: No Current Homicidal Plan: No Access to Homicidal Means: No History of harm to others?: No Assessment of Violence: None Noted Does patient have access to weapons?: No Criminal Charges Pending?: No Does patient have a court date: No  Psychosis Hallucinations: None noted Delusions: None noted  Mental Status Report Appear/Hygiene: Disheveled Eye Contact: Fair Motor Activity: Freedom of movement Speech:  Logical/coherent;Soft;Slow Level of Consciousness: Quiet/awake;Drowsy Mood: Depressed Affect: Appropriate to circumstance;Blunted Anxiety Level: None Thought Processes: Coherent;Relevant Judgement: Impaired Orientation: Person;Place;Time;Situation Obsessive Compulsive Thoughts/Behaviors: None  Cognitive Functioning Concentration: Normal Memory: Remote Intact;Recent Intact IQ: Average Insight: Fair Impulse Control: Poor Appetite: Poor Weight Loss: 9 Weight Gain: 0 Sleep: Decreased Total Hours of Sleep:  (fleeting-short periods) Vegetative Symptoms: None  ADLScreening [facility] Assessment Services) Patient's cognitive ability adequate to safely complete daily activities?: Yes Patient able to express need for assistance with ADLs?: Yes Independently performs ADLs?: Yes (appropriate for developmental age)  Prior Inpatient Therapy Prior Inpatient Therapy: No  Prior Outpatient Therapy Prior Outpatient Therapy: Yes Prior Therapy Dates: ongoing Prior Therapy Facilty/Provider(s): [facility], About to start at [facility] Reason for Treatment: Depression  ADL Screening (condition at time of admission) Patient's cognitive ability adequate to safely complete daily activities?: Yes Patient able to express need for assistance with ADLs?: Yes Independently performs ADLs?: Yes (appropriate for developmental age)       Abuse/Neglect Assessment (Assessment to be complete while patient is alone) Physical Abuse: Denies Verbal Abuse: Denies Sexual Abuse: Denies  Exploitation of patient/patient's resources: Denies Values / Beliefs Cultural Requests During Hospitalization: None Spiritual Requests During Hospitalization: None   Advance Directives (For Healthcare) Advance Directive: Patient does not have advance directive;Patient would not like information Nutrition Screen- [facility] Patient's home diet: Regular  Additional Information 1:1 In Past 12 Months?: No CIRT  Risk: No Elopement Risk: No Does patient have medical clearance?: Yes     Disposition:  Disposition Initial Assessment Completed for this Encounter: Yes Disposition of Patient: Other dispositions Other disposition(s): Other (Comment) (Hold for further eval in Am)  Davee [facility]in [facility] 03/28/2013 1:13 AM

## 2013-03-28 NOTE — ED Notes (Signed)
Patient medication secured in pharmacy.

## 2013-03-28 NOTE — Consult Note (Signed)
[facility] Face-to-Face Psychiatry Consult   Reason for Consult:  Depression Referring Physician:  EDP  Maria Schaefer is an 38 y.o. female. Total Time spent with patient: 45 minutes  Assessment: AXIS I:  Major Depression, Recurrent severe AXIS II:  Deferred AXIS III:   Past Medical History  Diagnosis Date  . Insomnia   . Hx of varicella   . Dysrhythmia     PVSs  . Anxiety   . Depression     tried to harm herself once  . Intermittent low back pain   . AMA (advanced maternal age) multigravida 13+    AXIS IV:  other psychosocial or environmental problems AXIS V:  51-60 moderate symptoms  Plan:  No evidence of imminent risk to self or others at present.   Patient does not meet criteria for psychiatric inpatient admission. Supportive therapy provided about ongoing stressors. Discussed crisis plan, support from social network, calling 911, coming to the Emergency Department, and calling Suicide Hotline.  Subjective:   Maria Schaefer is a 38 y.o. female patient.  HPI:  Patient states that she had been feeling overwhelmed working as a Multimedia programmer, being separated from her 70 month old child, and not being with the father of her baby."  Patient states that the father of her child is with someone else and that she still loves him and misses their family.  Patient states that she had been sick for several days and has been to see GI specialist; has been seeing her primary physician and yesterday "I was just sick; and wanted to rest and stop throwing up I took to much medication.  I don't want to die; I want to be here for my child; this is my last 2 weeks of my travel assignment; I want to be closer to home so that I can be with my child; I have mand ans appointment with Dr. Fletcher Anon at [facility] in [place]; I am going to call and see if can move my appointment up.  Patient states that she does have a past history of suicide attempt.  States that she is taking Zoloft for her depression. At  this time patient denies suicidal/homicidal ideation, psychosis, and paranoia.  Patient also states that her job keeps her motivated and can't miss any days.   HPI Elements:   Location:  Worsening depression. Quality:  Taking too much of her mediation. Severity:  Taking too much of her mediation.  . Timing:  yesterday. Patient denies family history of mental illness Past Psychiatric History: Past Medical History  Diagnosis Date  . Insomnia   . Hx of varicella   . Dysrhythmia     PVSs  . Anxiety   . Depression     tried to harm herself once  . Intermittent low back pain   . AMA (advanced maternal age) multigravida 35+     reports that she has never smoked. She has never used smokeless tobacco. She reports that she does not drink alcohol or use illicit drugs. Family History  Problem Relation Age of Onset  . Hypertension Sister   . Diabetes Sister    Family History Substance Abuse: No Family Supports: Yes, List: Living Arrangements: Alone (shares custody of 6 mo son) Can pt return to current living arrangement?: Yes Abuse/Neglect [facility]) Physical Abuse: Denies Verbal Abuse: Denies Sexual Abuse: Denies Allergies:  No Known Allergies  ACT Assessment Complete:  Yes:    Educational Status    Risk to Self: Risk to self  Suicidal Ideation: No-Not Currently/Within Last 6 Months Suicidal Intent: No Is patient at risk for suicide?: Yes Suicidal Plan?: No-Not Currently/Within Last 6 Months (thoughts of overdosing) Access to Means: Yes Specify Access to Suicidal Means: Rx medications What has been your use of drugs/alcohol within the last 12 months?: denies Previous Attempts/Gestures: Yes How many times?: 1 Triggers for Past Attempts: Other personal contacts Intentional Self Injurious Behavior: None Family Suicide History: No Recent stressful life event(s): Legal Issues;Turmoil (Comment) (boyfriend cheated, custody issues for 59 month old son) Persecutory voices/beliefs?:  No Depression: Yes Depression Symptoms: Despondent;Feeling worthless/self pity;Loss of interest in usual pleasures;Guilt;Fatigue;Tearfulness;Insomnia Substance abuse history and/or treatment for substance abuse?: No Suicide prevention information given to non-admitted patients: Not applicable  Risk to Others: Risk to Others Homicidal Ideation: No Thoughts of Harm to Others: No Current Homicidal Intent: No Current Homicidal Plan: No Access to Homicidal Means: No History of harm to others?: No Assessment of Violence: None Noted Does patient have access to weapons?: No Criminal Charges Pending?: No Does patient have a court date: No  Abuse: Abuse/Neglect Assessment (Assessment to be complete while patient is alone) Physical Abuse: Denies Verbal Abuse: Denies Sexual Abuse: Denies Exploitation of patient/patient's resources: Denies  Prior Inpatient Therapy: Prior Inpatient Therapy Prior Inpatient Therapy: No  Prior Outpatient Therapy: Prior Outpatient Therapy Prior Outpatient Therapy: Yes Prior Therapy Dates: ongoing Prior Therapy Facilty/Provider(s): [facility], About to start at [facility] Reason for Treatment: Depression  Additional Information: Additional Information 1:1 In Past 12 Months?: No CIRT Risk: No Elopement Risk: No Does patient have medical clearance?: Yes                  Objective: Blood pressure 128/84, pulse 93, temperature 98.2 F (36.8 C), temperature source Oral, resp. rate 18, SpO2 100.00%.There is no weight on file to calculate BMI. Results for orders placed during the hospital encounter of 03/27/13 (from the past 72 hour(s))  ACETAMINOPHEN LEVEL     Status: None   Collection Time    03/27/13  6:30 PM      Result Value Ref Range   Acetaminophen (Tylenol), Serum <15.0  10 - 30 ug/mL   Comment:            THERAPEUTIC CONCENTRATIONS VARY     SIGNIFICANTLY. A RANGE OF 10-30     ug/mL MAY BE AN EFFECTIVE     CONCENTRATION FOR  MANY PATIENTS.     HOWEVER, SOME ARE BEST TREATED     AT CONCENTRATIONS OUTSIDE THIS     RANGE.     ACETAMINOPHEN CONCENTRATIONS     >150 ug/mL AT 4 HOURS AFTER     INGESTION AND >50 ug/mL AT 12     HOURS AFTER INGESTION ARE     OFTEN ASSOCIATED WITH TOXIC     REACTIONS.  ETHANOL     Status: None   Collection Time    03/27/13  6:30 PM      Result Value Ref Range   Alcohol, Ethyl (B) <11  0 - 11 mg/dL   Comment:            LOWEST DETECTABLE LIMIT FOR     SERUM ALCOHOL IS 11 mg/dL     FOR MEDICAL PURPOSES ONLY  SALICYLATE LEVEL     Status: Abnormal   Collection Time    03/27/13  6:30 PM      Result Value Ref Range   Salicylate Lvl <7.1 (*) 2.8 - 20.0 mg/dL  CBC WITH DIFFERENTIAL     Status: None   Collection Time    03/27/13  6:30 PM      Result Value Ref Range   WBC 8.0  4.0 - 10.5 K/uL   RBC 4.25  3.87 - 5.11 MIL/uL   Hemoglobin 12.8  12.0 - 15.0 g/dL   HCT 37.2  36.0 - 46.0 %   MCV 87.5  78.0 - 100.0 fL   MCH 30.1  26.0 - 34.0 pg   MCHC 34.4  30.0 - 36.0 g/dL   RDW 12.4  11.5 - 15.5 %   Platelets 393  150 - 400 K/uL   Neutrophils Relative % 74  43 - 77 %   Neutro Abs 5.9  1.7 - 7.7 K/uL   Lymphocytes Relative 22  12 - 46 %   Lymphs Abs 1.7  0.7 - 4.0 K/uL   Monocytes Relative 3  3 - 12 %   Monocytes Absolute 0.3  0.1 - 1.0 K/uL   Eosinophils Relative 0  0 - 5 %   Eosinophils Absolute 0.0  0.0 - 0.7 K/uL   Basophils Relative 0  0 - 1 %   Basophils Absolute 0.0  0.0 - 0.1 K/uL  COMPREHENSIVE METABOLIC PANEL     Status: Abnormal   Collection Time    03/27/13  6:30 PM      Result Value Ref Range   Sodium 141  137 - 147 mEq/L   Potassium 3.5 (*) 3.7 - 5.3 mEq/L   Chloride 104  96 - 112 mEq/L   CO2 24  19 - 32 mEq/L   Glucose, Bld 91  70 - 99 mg/dL   BUN 6  6 - 23 mg/dL   Creatinine, Ser 0.81  0.50 - 1.10 mg/dL   Calcium 9.6  8.4 - 10.5 mg/dL   Total Protein 7.2  6.0 - 8.3 g/dL   Albumin 3.7  3.5 - 5.2 g/dL   AST 13  0 - 37 U/L   ALT 11  0 - 35 U/L   Alkaline  Phosphatase 101  39 - 117 U/L   Total Bilirubin 0.7  0.3 - 1.2 mg/dL   GFR calc non Af Amer >90  >90 mL/min   GFR calc Af Amer >90  >90 mL/min   Comment: (NOTE)     The eGFR has been calculated using the CKD EPI equation.     This calculation has not been validated in all clinical situations.     eGFR's persistently <90 mL/min signify possible Chronic Kidney     Disease.  PROTIME-INR     Status: None   Collection Time    03/27/13  6:30 PM      Result Value Ref Range   Prothrombin Time 12.6  11.6 - 15.2 seconds   INR 0.96  0.00 - 1.49  URINE RAPID DRUG SCREEN (HOSP PERFORMED)     Status: None   Collection Time    03/27/13  7:12 PM      Result Value Ref Range   Opiates NONE DETECTED  NONE DETECTED   Cocaine NONE DETECTED  NONE DETECTED   Benzodiazepines NONE DETECTED  NONE DETECTED   Amphetamines NONE DETECTED  NONE DETECTED   Tetrahydrocannabinol NONE DETECTED  NONE DETECTED   Barbiturates NONE DETECTED  NONE DETECTED   Comment:            DRUG SCREEN FOR MEDICAL PURPOSES     ONLY.  IF CONFIRMATION IS NEEDED  FOR ANY PURPOSE, NOTIFY LAB     WITHIN 5 DAYS.                LOWEST DETECTABLE LIMITS     FOR URINE DRUG SCREEN     Drug Class       Cutoff (ng/mL)     Amphetamine      1000     Barbiturate      200     Benzodiazepine   130     Tricyclics       865     Opiates          300     Cocaine          300     THC              50  URINALYSIS, ROUTINE W REFLEX MICROSCOPIC     Status: Abnormal   Collection Time    03/27/13  7:12 PM      Result Value Ref Range   Color, Urine YELLOW  YELLOW   APPearance CLOUDY (*) CLEAR   Specific Gravity, Urine 1.021  1.005 - 1.030   pH 5.5  5.0 - 8.0   Glucose, UA NEGATIVE  NEGATIVE mg/dL   Hgb urine dipstick LARGE (*) NEGATIVE   Bilirubin Urine NEGATIVE  NEGATIVE   Ketones, ur 15 (*) NEGATIVE mg/dL   Protein, ur NEGATIVE  NEGATIVE mg/dL   Urobilinogen, UA 0.2  0.0 - 1.0 mg/dL   Nitrite NEGATIVE  NEGATIVE   Leukocytes, UA TRACE  (*) NEGATIVE  PREGNANCY, URINE     Status: None   Collection Time    03/27/13  7:12 PM      Result Value Ref Range   Preg Test, Ur NEGATIVE  NEGATIVE   Comment:            THE SENSITIVITY OF THIS     METHODOLOGY IS >20 mIU/mL.  URINE MICROSCOPIC-ADD ON     Status: Abnormal   Collection Time    03/27/13  7:12 PM      Result Value Ref Range   Squamous Epithelial / LPF MANY (*) RARE   WBC, UA 0-2  <3 WBC/hpf   RBC / HPF 7-10  <3 RBC/hpf   Bacteria, UA RARE  RARE  SALICYLATE LEVEL     Status: Abnormal   Collection Time    03/27/13 10:30 PM      Result Value Ref Range   Salicylate Lvl <7.8 (*) 2.8 - 20.0 mg/dL  ACETAMINOPHEN LEVEL     Status: None   Collection Time    03/27/13 10:30 PM      Result Value Ref Range   Acetaminophen (Tylenol), Serum <15.0  10 - 30 ug/mL   Comment:            THERAPEUTIC CONCENTRATIONS VARY     SIGNIFICANTLY. A RANGE OF 10-30     ug/mL MAY BE AN EFFECTIVE     CONCENTRATION FOR MANY PATIENTS.     HOWEVER, SOME ARE BEST TREATED     AT CONCENTRATIONS OUTSIDE THIS     RANGE.     ACETAMINOPHEN CONCENTRATIONS     >150 ug/mL AT 4 HOURS AFTER     INGESTION AND >50 ug/mL AT 12     HOURS AFTER INGESTION ARE     OFTEN ASSOCIATED WITH TOXIC     REACTIONS.   Labs are reviewed and are pertinent for no critical or significant findings.  Current  Facility-Administered Medications  Medication Dose Route Frequency Provider Last Rate Last Dose  . 0.9 %  sodium chloride infusion   Intravenous Continuous Alfonzo Feller, DO      . acetaminophen (TYLENOL) tablet 650 mg  650 mg Oral T2I PRN Delora Fuel, MD      . alum & mag hydroxide-simeth (MAALOX/MYLANTA) 200-200-20 MG/5ML suspension 30 mL  30 mL Oral PRN Delora Fuel, MD      . ibuprofen (ADVIL,MOTRIN) tablet 600 mg  600 mg Oral Z1I PRN Delora Fuel, MD      . ondansetron [facility]) tablet 4 mg  4 mg Oral W5Y PRN Delora Fuel, MD      . zolpidem [facility]) tablet 5 mg  5 mg Oral QHS PRN Delora Fuel, MD        Current Outpatient Prescriptions  Medication Sig Dispense Refill  . HYDROcodone-acetaminophen (VICODIN) 5-500 MG per tablet Take 1 tablet by mouth daily as needed. Severe pain  30 tablet  0  . ibuprofen (ADVIL,MOTRIN) 800 MG tablet Take 1 tablet (800 mg total) by mouth every 8 (eight) hours as needed. Pain  30 tablet  0    Psychiatric Specialty Exam:     Blood pressure 128/84, pulse 93, temperature 98.2 F (36.8 C), temperature source Oral, resp. rate 18, SpO2 100.00%.There is no weight on file to calculate BMI.  General Appearance: Casual  Eye Contact::  Good  Speech:  Clear and Coherent and Normal Rate  Volume:  Normal  Mood:  Depressed  Affect:  Congruent  Thought Process:  Circumstantial  Orientation:  Full (Time, Place, and Person)  Thought Content:  WDL  Suicidal Thoughts:  No  Homicidal Thoughts:  No  Memory:  Immediate;   Good Recent;   Good Remote;   Good  Judgement:  Fair  Insight:  Good  Psychomotor Activity:  Normal  Concentration:  Good  Recall:  Good  Fund of Knowledge:Good  Language: Good  Akathisia:  No  Handed:  Right  AIMS (if indicated):     Assets:  Communication Skills Desire for Improvement Housing Resilience Social Support  Sleep:      Musculoskeletal: Strength & Muscle Tone: within normal limits Gait & Station: normal Patient leans: N/A  Treatment Plan Summary: Outpatient services  Discharge Assessment     Demographic Factors:  Female  Total Time spent with patient: 15 minutes  Psychiatric Specialty Exam: Same as above  Musculoskeletal: Same as above  Mental Status Per Nursing Assessment::   On Admission:     Current Mental Status by Physician: Patient denies suicidal/homicidal ideation, psychosis, and paranoia  Loss Factors: Relationship with father of child  Historical Factors: NA  Risk Reduction Factors:   Responsible for children under 57 years of age, Sense of responsibility to family, Religious beliefs about  death, Living with another person, especially a relative and Positive social support  Continued Clinical Symptoms:  Depression  Cognitive Features That Contribute To Risk:  None    Suicide Risk:  Minimal: No identifiable suicidal ideation.  Patients presenting with no risk factors but with morbid ruminations; may be classified as minimal risk based on the severity of the depressive symptoms  Discharge Diagnoses:  Same as above  Plan Of Care/Follow-up recommendations:  Activity:  Resume usual activity Diet:  Resume usual diet  Is patient on multiple antipsychotic therapies at discharge:  No   Has Patient had three or more failed trials of antipsychotic monotherapy by history:  No  Recommended Plan for Multiple  Antipsychotic Therapies: NA   Maria Lewing, FNP-BC 03/28/2013 10:19 AM

## 2013-03-28 NOTE — Progress Notes (Signed)
Per discussion with psychiatrist and NP, Pt psychiatrically stable for dc home and f/u with outpatient provider. .No further Clinical Social Work needs, signing off.   Belia Heman, [place]  ED CSW  03/28/2013 1029am

## 2013-03-28 NOTE — Consult Note (Signed)
Face to face evaluation and I agree with this note 

## 2013-03-28 NOTE — ED Provider Notes (Signed)
TTS evaluation appreciated. She will be kept in the ED for evaluation by psychiatrist in AM.  Delora Fuel, MD 34/03/70 [id number]

## 2013-03-28 NOTE — BH Assessment (Signed)
[facility] Assessment Progress Note      Spoke with patient's provider who reports that she overdosed to find peace.  TTS consult scheduled for 00:25

## 2013-04-01 ENCOUNTER — Encounter (HOSPITAL_COMMUNITY): Payer: Self-pay | Admitting: Emergency Medicine

## 2013-05-23 ENCOUNTER — Ambulatory Visit
Admission: RE | Admit: 2013-05-23 | Discharge: 2013-05-23 | Disposition: A | Discharge status: Home / Self Care | Payer: 59 | Source: Ambulatory Visit | Attending: Family Medicine | Admitting: Family Medicine

## 2013-05-23 ENCOUNTER — Other Ambulatory Visit: Payer: Self-pay | Admitting: Family Medicine

## 2013-05-23 DIAGNOSIS — M542 Cervicalgia: Secondary | ICD-10-CM

## 2013-05-28 ENCOUNTER — Encounter (INDEPENDENT_AMBULATORY_CARE_PROVIDER_SITE_OTHER): Payer: Self-pay | Admitting: Surgery

## 2013-05-29 ENCOUNTER — Ambulatory Visit (INDEPENDENT_AMBULATORY_CARE_PROVIDER_SITE_OTHER): Payer: 59 | Admitting: Surgery

## 2013-05-29 ENCOUNTER — Encounter (INDEPENDENT_AMBULATORY_CARE_PROVIDER_SITE_OTHER): Payer: Self-pay | Admitting: Surgery

## 2013-05-29 VITALS — BP 110/70 | HR 80 | Temp 97.0°F | Resp 14 | Ht 67.0 in | Wt 191.6 lb

## 2013-05-29 DIAGNOSIS — K811 Chronic cholecystitis: Secondary | ICD-10-CM | POA: Insufficient documentation

## 2013-05-29 NOTE — Progress Notes (Signed)
Patient ID: Maria Schaefer, female   DOB: 05/25/1975, 38 y.o.   MRN: [id number]  Chief Complaint  Patient presents with  . New Evaluation    eval GB injection    HPI Maria Schaefer is a 38 y.o. female.   HPI She is referred by Dr. Verdia Kuba for evaluation of nausea and vomiting.  This has been occuring for approx 7 to 8 months.  She will have occasional mild abdominal discomfort.  It occurs randomly after just about anything she eats.  It is now getting worse.  Her workup has been negative including an U/S, HIDA scan, and upper endoscopy.  She denies jaundice.  Past Medical History  Diagnosis Date  . Insomnia   . Hx of varicella   . Dysrhythmia     PVSs  . Anxiety   . Depression     tried to harm herself once  . Intermittent low back pain   . AMA (advanced maternal age) multigravida 72+   . Intentional drug overdose 06/2011    OD on Lorrin Mais    Past Surgical History  Procedure Laterality Date  . No past surgeries      Family History  Problem Relation Age of Onset  . Hypertension Sister   . Diabetes Sister     Social History History  Substance Use Topics  . Smoking status: Never Smoker   . Smokeless tobacco: Never Used  . Alcohol Use: Yes     Comment: occ    No Known Allergies  Current Outpatient Prescriptions  Medication Sig Dispense Refill  . sertraline (ZOLOFT) 100 MG tablet Take 1 tablet (100 mg total) by mouth daily.  30 tablet  1  . zolpidem (AMBIEN CR) 12.5 MG CR tablet Take 12.5 mg by mouth at bedtime as needed for sleep.      Marland Kitchen HYDROcodone-acetaminophen (VICODIN) 5-500 MG per tablet Take 1 tablet by mouth daily as needed. Severe pain  30 tablet  0  . hydrOXYzine (ATARAX/VISTARIL) 25 MG tablet Take 1 tablet (25 mg total) by mouth 4 (four) times daily as needed for anxiety.  30 tablet  1   No current facility-administered medications for this visit.    Review of Systems Review of Systems  Constitutional: Negative for fever, chills and unexpected weight change.   HENT: Negative for congestion, hearing loss, sore throat, trouble swallowing and voice change.   Eyes: Negative for visual disturbance.  Respiratory: Negative for cough and wheezing.   Cardiovascular: Negative for chest pain, palpitations and leg swelling.  Gastrointestinal: Positive for nausea, vomiting, abdominal pain and abdominal distention. Negative for diarrhea, constipation, blood in stool and anal bleeding.  Genitourinary: Negative for hematuria, vaginal bleeding and difficulty urinating.  Musculoskeletal: Negative for arthralgias.  Skin: Negative for rash and wound.  Neurological: Negative for seizures, syncope and headaches.  Hematological: Negative for adenopathy. Does not bruise/bleed easily.  Psychiatric/Behavioral: Negative for confusion.    Blood pressure 110/70, pulse 80, temperature 97 F (36.1 C), temperature source Temporal, resp. rate 14, height 5\' 7"  (1.702 m), weight 191 lb 9.6 oz (86.909 kg).  Physical Exam Physical Exam  Constitutional: She is oriented to person, place, and time. She appears well-developed and well-nourished. No distress.  HENT:  Head: Normocephalic and atraumatic.  Right Ear: External ear normal.  Left Ear: External ear normal.  Nose: Nose normal.  Mouth/Throat: Oropharynx is clear and moist. No oropharyngeal exudate.  Eyes: Conjunctivae are normal. Pupils are equal, round, and reactive to light. Right eye exhibits no  discharge. Left eye exhibits no discharge. No scleral icterus.  Neck: Normal range of motion. Neck supple. No tracheal deviation present.  Cardiovascular: Normal rate, regular rhythm, normal heart sounds and intact distal pulses.   No murmur heard. Pulmonary/Chest: Effort normal and breath sounds normal.  Abdominal: Soft. Bowel sounds are normal. There is tenderness. There is no guarding.  Musculoskeletal: Normal range of motion. She exhibits no edema and no tenderness.  Lymphadenopathy:    She has no cervical adenopathy.   Neurological: She is alert and oriented to person, place, and time.  Skin: Skin is warm and dry. No rash noted. No erythema.  Psychiatric: Her behavior is normal. Judgment normal.    Data Reviewed   Assessment    Chronic cholecystitis     Plan    I do suspect she has chronic cholecystitis and some biliary dyskinesia.  We discussed continued conservative management vs lap chole.  She really wants to proceed with surgery which I think is reasonable.  I discussed the procedure in detail.  The patient was given Neurosurgeon.  We discussed the risks and benefits of a laparoscopic cholecystectomy and possible cholangiogram including, but not limited to bleeding, infection, injury to surrounding structures such as the intestine or liver, bile leak, retained gallstones, need to convert to an open procedure, prolonged diarrhea, blood clots such as  DVT, common bile duct injury, anesthesia risks, and possible need for additional procedures.  She also understands that this may not resolve her symptoms at all. We discussed the typical post-operative recovery course.        Harl Bowie 05/29/2013, 10:49 AM

## 2013-06-05 ENCOUNTER — Telehealth (INDEPENDENT_AMBULATORY_CARE_PROVIDER_SITE_OTHER): Payer: Self-pay | Admitting: Surgery

## 2013-06-05 NOTE — Telephone Encounter (Signed)
06/05/13 patient met with surgery scheduling on 05/29/13 went over financial responsibilities, patient said would call back to schedule.

## 2013-06-28 ENCOUNTER — Encounter (INDEPENDENT_AMBULATORY_CARE_PROVIDER_SITE_OTHER): Payer: Self-pay

## 2013-09-26 DIAGNOSIS — R112 Nausea with vomiting, unspecified: Secondary | ICD-10-CM | POA: Diagnosis present

## 2013-09-26 DIAGNOSIS — R1011 Right upper quadrant pain: Secondary | ICD-10-CM | POA: Diagnosis present

## 2013-11-12 ENCOUNTER — Encounter (INDEPENDENT_AMBULATORY_CARE_PROVIDER_SITE_OTHER): Payer: Self-pay | Admitting: Surgery

## 2014-04-18 ENCOUNTER — Other Ambulatory Visit: Payer: Self-pay | Admitting: Physician Assistant

## 2014-04-18 ENCOUNTER — Ambulatory Visit
Admission: RE | Admit: 2014-04-18 | Discharge: 2014-04-18 | Disposition: A | Discharge status: Home / Self Care | Payer: 59 | Source: Ambulatory Visit | Attending: Physician Assistant | Admitting: Physician Assistant

## 2014-04-18 DIAGNOSIS — M533 Sacrococcygeal disorders, not elsewhere classified: Secondary | ICD-10-CM

## 2014-04-18 DIAGNOSIS — W19XXXA Unspecified fall, initial encounter: Secondary | ICD-10-CM

## 2014-07-22 ENCOUNTER — Emergency Department (HOSPITAL_BASED_OUTPATIENT_CLINIC_OR_DEPARTMENT_OTHER)
Admission: EM | Admit: 2014-07-22 | Discharge: 2014-07-22 | Disposition: A | Discharge status: Home / Self Care | Payer: Managed Care, Other (non HMO) | Attending: Emergency Medicine | Admitting: Emergency Medicine

## 2014-07-22 ENCOUNTER — Encounter (HOSPITAL_BASED_OUTPATIENT_CLINIC_OR_DEPARTMENT_OTHER): Payer: Self-pay | Admitting: *Deleted

## 2014-07-22 DIAGNOSIS — Y9389 Activity, other specified: Secondary | ICD-10-CM | POA: Diagnosis not present

## 2014-07-22 DIAGNOSIS — Y998 Other external cause status: Secondary | ICD-10-CM | POA: Insufficient documentation

## 2014-07-22 DIAGNOSIS — Y9289 Other specified places as the place of occurrence of the external cause: Secondary | ICD-10-CM | POA: Insufficient documentation

## 2014-07-22 DIAGNOSIS — Z8739 Personal history of other diseases of the musculoskeletal system and connective tissue: Secondary | ICD-10-CM | POA: Insufficient documentation

## 2014-07-22 DIAGNOSIS — Z8619 Personal history of other infectious and parasitic diseases: Secondary | ICD-10-CM | POA: Insufficient documentation

## 2014-07-22 DIAGNOSIS — F419 Anxiety disorder, unspecified: Secondary | ICD-10-CM | POA: Insufficient documentation

## 2014-07-22 DIAGNOSIS — I499 Cardiac arrhythmia, unspecified: Secondary | ICD-10-CM | POA: Insufficient documentation

## 2014-07-22 DIAGNOSIS — G4700 Insomnia, unspecified: Secondary | ICD-10-CM | POA: Diagnosis not present

## 2014-07-22 DIAGNOSIS — Z79899 Other long term (current) drug therapy: Secondary | ICD-10-CM | POA: Diagnosis not present

## 2014-07-22 DIAGNOSIS — F329 Major depressive disorder, single episode, unspecified: Secondary | ICD-10-CM | POA: Insufficient documentation

## 2014-07-22 DIAGNOSIS — T7421XA Adult sexual abuse, confirmed, initial encounter: Secondary | ICD-10-CM | POA: Diagnosis not present
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
## 2014-07-22 NOTE — ED Notes (Signed)
Pt here for medical clearance.
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
## 2014-07-22 NOTE — ED Notes (Signed)
SANE nurse at bedside.
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
## 2015-02-19 DIAGNOSIS — L700 Acne vulgaris: Secondary | ICD-10-CM | POA: Insufficient documentation

## 2015-04-03 ENCOUNTER — Other Ambulatory Visit: Payer: Self-pay | Admitting: Physician Assistant

## 2015-04-03 ENCOUNTER — Other Ambulatory Visit (HOSPITAL_COMMUNITY)
Admission: RE | Admit: 2015-04-03 | Discharge: 2015-04-03 | Disposition: A | Discharge status: Home / Self Care | Payer: 59 | Source: Ambulatory Visit | Attending: Family Medicine | Admitting: Family Medicine

## 2015-04-03 DIAGNOSIS — Z124 Encounter for screening for malignant neoplasm of cervix: Secondary | ICD-10-CM | POA: Diagnosis not present

## 2015-04-07 LAB — CYTOLOGY - PAP

## 2015-05-02 ENCOUNTER — Other Ambulatory Visit: Payer: Self-pay

## 2015-05-02 DIAGNOSIS — Z1231 Encounter for screening mammogram for malignant neoplasm of breast: Secondary | ICD-10-CM

## 2015-05-22 ENCOUNTER — Ambulatory Visit
Admission: RE | Admit: 2015-05-22 | Discharge: 2015-05-22 | Disposition: A | Discharge status: Home / Self Care | Payer: 59 | Source: Ambulatory Visit

## 2015-05-22 DIAGNOSIS — Z1231 Encounter for screening mammogram for malignant neoplasm of breast: Secondary | ICD-10-CM

## 2015-05-23 ENCOUNTER — Ambulatory Visit: Payer: 59

## 2016-04-16 ENCOUNTER — Other Ambulatory Visit: Payer: Self-pay | Admitting: Family Medicine

## 2016-04-16 DIAGNOSIS — Z1231 Encounter for screening mammogram for malignant neoplasm of breast: Secondary | ICD-10-CM

## 2016-05-26 ENCOUNTER — Ambulatory Visit: Payer: 59

## 2016-05-27 ENCOUNTER — Ambulatory Visit
Admission: RE | Admit: 2016-05-27 | Discharge: 2016-05-27 | Disposition: A | Discharge status: Home / Self Care | Payer: 59 | Source: Ambulatory Visit | Attending: Family Medicine | Admitting: Family Medicine

## 2016-05-27 DIAGNOSIS — Z1231 Encounter for screening mammogram for malignant neoplasm of breast: Secondary | ICD-10-CM

## 2016-05-31 ENCOUNTER — Other Ambulatory Visit: Payer: Self-pay | Admitting: Family Medicine

## 2016-05-31 DIAGNOSIS — R928 Other abnormal and inconclusive findings on diagnostic imaging of breast: Secondary | ICD-10-CM

## 2016-06-01 ENCOUNTER — Ambulatory Visit
Admission: RE | Admit: 2016-06-01 | Discharge: 2016-06-01 | Disposition: A | Discharge status: Home / Self Care | Payer: 59 | Source: Ambulatory Visit | Attending: Family Medicine | Admitting: Family Medicine

## 2016-06-01 DIAGNOSIS — R928 Other abnormal and inconclusive findings on diagnostic imaging of breast: Secondary | ICD-10-CM
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
## 2017-02-09 DIAGNOSIS — M7542 Impingement syndrome of left shoulder: Secondary | ICD-10-CM | POA: Insufficient documentation

## 2017-04-25 ENCOUNTER — Other Ambulatory Visit: Payer: Self-pay | Admitting: Family Medicine

## 2017-04-25 ENCOUNTER — Other Ambulatory Visit (HOSPITAL_COMMUNITY)
Admission: RE | Admit: 2017-04-25 | Discharge: 2017-04-25 | Disposition: A | Discharge status: Home / Self Care | Payer: 59 | Source: Ambulatory Visit | Attending: Family Medicine | Admitting: Family Medicine

## 2017-04-25 DIAGNOSIS — Z124 Encounter for screening for malignant neoplasm of cervix: Secondary | ICD-10-CM | POA: Insufficient documentation

## 2017-04-27 LAB — CYTOLOGY - PAP
Diagnosis: NEGATIVE
HPV: NOT DETECTED

## 2017-08-19 ENCOUNTER — Other Ambulatory Visit: Payer: Self-pay | Admitting: Family Medicine

## 2017-08-19 DIAGNOSIS — Z1231 Encounter for screening mammogram for malignant neoplasm of breast: Secondary | ICD-10-CM

## 2017-09-23 ENCOUNTER — Ambulatory Visit: Payer: 59

## 2017-09-23 ENCOUNTER — Ambulatory Visit
Admission: RE | Admit: 2017-09-23 | Discharge: 2017-09-23 | Disposition: A | Discharge status: Home / Self Care | Payer: 59 | Source: Ambulatory Visit | Attending: Family Medicine | Admitting: Family Medicine

## 2017-09-23 DIAGNOSIS — Z1231 Encounter for screening mammogram for malignant neoplasm of breast: Secondary | ICD-10-CM

## 2018-10-12 ENCOUNTER — Other Ambulatory Visit: Payer: Self-pay | Admitting: Family Medicine

## 2018-10-12 DIAGNOSIS — Z1231 Encounter for screening mammogram for malignant neoplasm of breast: Secondary | ICD-10-CM

## 2018-11-02 ENCOUNTER — Ambulatory Visit
Admission: RE | Admit: 2018-11-02 | Discharge: 2018-11-02 | Disposition: A | Discharge status: Home / Self Care | Payer: 59 | Source: Ambulatory Visit | Attending: Family Medicine | Admitting: Family Medicine

## 2018-11-02 ENCOUNTER — Other Ambulatory Visit: Payer: Self-pay

## 2018-11-02 DIAGNOSIS — Z1231 Encounter for screening mammogram for malignant neoplasm of breast: Secondary | ICD-10-CM

## 2018-12-01 DIAGNOSIS — M1711 Unilateral primary osteoarthritis, right knee: Secondary | ICD-10-CM | POA: Insufficient documentation

## 2018-12-01 DIAGNOSIS — M1712 Unilateral primary osteoarthritis, left knee: Secondary | ICD-10-CM | POA: Insufficient documentation

## 2018-12-20 DIAGNOSIS — L249 Irritant contact dermatitis, unspecified cause: Secondary | ICD-10-CM | POA: Insufficient documentation

## 2018-12-21 DIAGNOSIS — L280 Lichen simplex chronicus: Secondary | ICD-10-CM | POA: Insufficient documentation

## 2019-08-09 ENCOUNTER — Other Ambulatory Visit: Payer: Self-pay

## 2019-08-09 ENCOUNTER — Encounter: Payer: 59 | Attending: Family Medicine | Admitting: Skilled Nursing Facility1

## 2019-08-09 ENCOUNTER — Encounter: Payer: Self-pay | Admitting: Skilled Nursing Facility1

## 2019-08-09 DIAGNOSIS — E669 Obesity, unspecified: Secondary | ICD-10-CM | POA: Diagnosis not present
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
## 2019-09-10 ENCOUNTER — Ambulatory Visit: Payer: 59 | Admitting: Skilled Nursing Facility1

## 2019-10-04 ENCOUNTER — Other Ambulatory Visit: Payer: Self-pay | Admitting: Family Medicine

## 2019-10-04 DIAGNOSIS — Z1231 Encounter for screening mammogram for malignant neoplasm of breast: Secondary | ICD-10-CM

## 2019-11-06 ENCOUNTER — Ambulatory Visit
Admission: RE | Admit: 2019-11-06 | Discharge: 2019-11-06 | Disposition: A | Discharge status: Home / Self Care | Payer: 59 | Source: Ambulatory Visit | Attending: Family Medicine | Admitting: Family Medicine

## 2019-11-06 ENCOUNTER — Other Ambulatory Visit: Payer: Self-pay

## 2019-11-06 DIAGNOSIS — Z1231 Encounter for screening mammogram for malignant neoplasm of breast: Secondary | ICD-10-CM

## 2020-08-08 ENCOUNTER — Other Ambulatory Visit: Payer: Self-pay

## 2020-08-08 ENCOUNTER — Ambulatory Visit
Admission: RE | Admit: 2020-08-08 | Discharge: 2020-08-08 | Disposition: A | Discharge status: Home / Self Care | Payer: 59 | Source: Ambulatory Visit | Attending: Home Modifications | Admitting: Home Modifications

## 2020-08-08 ENCOUNTER — Other Ambulatory Visit: Payer: Self-pay | Admitting: Home Modifications

## 2020-08-08 DIAGNOSIS — S6992XA Unspecified injury of left wrist, hand and finger(s), initial encounter: Secondary | ICD-10-CM

## 2020-10-01 ENCOUNTER — Other Ambulatory Visit: Payer: Self-pay | Admitting: Family Medicine

## 2020-10-01 DIAGNOSIS — Z1231 Encounter for screening mammogram for malignant neoplasm of breast: Secondary | ICD-10-CM

## 2020-11-07 ENCOUNTER — Ambulatory Visit
Admission: RE | Admit: 2020-11-07 | Discharge: 2020-11-07 | Disposition: A | Discharge status: Home / Self Care | Payer: 59 | Source: Ambulatory Visit | Attending: Family Medicine | Admitting: Family Medicine

## 2020-11-07 ENCOUNTER — Other Ambulatory Visit: Payer: Self-pay

## 2020-11-07 ENCOUNTER — Encounter (HOSPITAL_BASED_OUTPATIENT_CLINIC_OR_DEPARTMENT_OTHER): Payer: Self-pay | Admitting: Emergency Medicine

## 2020-11-07 DIAGNOSIS — R112 Nausea with vomiting, unspecified: Secondary | ICD-10-CM | POA: Diagnosis not present

## 2020-11-07 DIAGNOSIS — R1013 Epigastric pain: Secondary | ICD-10-CM | POA: Diagnosis present

## 2020-11-07 DIAGNOSIS — Z1231 Encounter for screening mammogram for malignant neoplasm of breast: Secondary | ICD-10-CM

## 2020-11-07 DIAGNOSIS — Z20822 Contact with and (suspected) exposure to covid-19: Secondary | ICD-10-CM | POA: Insufficient documentation

## 2020-11-07 LAB — CBC
HCT: 38.9 % (ref 36.0–46.0)
Hemoglobin: 13.2 g/dL (ref 12.0–15.0)
MCH: 29.3 pg (ref 26.0–34.0)
MCHC: 33.9 g/dL (ref 30.0–36.0)
MCV: 86.4 fL (ref 80.0–100.0)
Platelets: 427 10*3/uL — ABNORMAL HIGH (ref 150–400)
RBC: 4.5 MIL/uL (ref 3.87–5.11)
RDW: 13 % (ref 11.5–15.5)
WBC: 10.8 10*3/uL — ABNORMAL HIGH (ref 4.0–10.5)
nRBC: 0 % (ref 0.0–0.2)

## 2020-11-07 LAB — COMPREHENSIVE METABOLIC PANEL
ALT: 17 U/L (ref 0–44)
AST: 19 U/L (ref 15–41)
Albumin: 3.6 g/dL (ref 3.5–5.0)
Alkaline Phosphatase: 79 U/L (ref 38–126)
Anion gap: 7 (ref 5–15)
BUN: 9 mg/dL (ref 6–20)
CO2: 23 mmol/L (ref 22–32)
Calcium: 8.7 mg/dL — ABNORMAL LOW (ref 8.9–10.3)
Chloride: 105 mmol/L (ref 98–111)
Creatinine, Ser: 0.79 mg/dL (ref 0.44–1.00)
GFR, Estimated: >60 mL/min (ref >60)
Glucose, Bld: 115 mg/dL — ABNORMAL HIGH (ref 70–99)
Potassium: 3.7 mmol/L (ref 3.5–5.1)
Sodium: 135 mmol/L (ref 135–145)
Total Bilirubin: 0.4 mg/dL (ref 0.3–1.2)
Total Protein: 7.2 g/dL (ref 6.5–8.1)

## 2020-11-07 LAB — URINALYSIS, ROUTINE W REFLEX MICROSCOPIC
Bilirubin Urine: NEGATIVE
Glucose, UA: NEGATIVE mg/dL
Ketones, ur: NEGATIVE mg/dL
Nitrite: NEGATIVE
Protein, ur: NEGATIVE mg/dL
Specific Gravity, Urine: >1.03 — ABNORMAL HIGH (ref 1.005–1.030)
pH: 7 (ref 5.0–8.0)

## 2020-11-07 LAB — URINALYSIS, MICROSCOPIC (REFLEX)

## 2020-11-07 LAB — PREGNANCY, URINE: Preg Test, Ur: NEGATIVE

## 2020-11-07 LAB — LIPASE, BLOOD: Lipase: 29 U/L (ref 11–51)
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
## 2020-11-08 ENCOUNTER — Emergency Department (HOSPITAL_BASED_OUTPATIENT_CLINIC_OR_DEPARTMENT_OTHER): Payer: 59

## 2020-11-08 ENCOUNTER — Encounter (HOSPITAL_COMMUNITY)
Admission: EM | Disposition: A | Discharge status: Home / Self Care | Payer: Self-pay | Source: Home / Self Care | Attending: Emergency Medicine

## 2020-11-08 ENCOUNTER — Encounter (HOSPITAL_COMMUNITY): Payer: Self-pay | Admitting: Anesthesiology

## 2020-11-08 ENCOUNTER — Emergency Department (HOSPITAL_BASED_OUTPATIENT_CLINIC_OR_DEPARTMENT_OTHER)
Admission: EM | Admit: 2020-11-08 | Discharge: 2020-11-08 | Disposition: A | Discharge status: Home / Self Care | Payer: 59 | Attending: Emergency Medicine | Admitting: Emergency Medicine

## 2020-11-08 ENCOUNTER — Encounter (HOSPITAL_BASED_OUTPATIENT_CLINIC_OR_DEPARTMENT_OTHER): Payer: Self-pay | Admitting: Radiology

## 2020-11-08 DIAGNOSIS — R112 Nausea with vomiting, unspecified: Secondary | ICD-10-CM | POA: Diagnosis not present

## 2020-11-08 DIAGNOSIS — K811 Chronic cholecystitis: Secondary | ICD-10-CM | POA: Diagnosis present

## 2020-11-08 DIAGNOSIS — Z20822 Contact with and (suspected) exposure to covid-19: Secondary | ICD-10-CM | POA: Diagnosis not present

## 2020-11-08 DIAGNOSIS — E785 Hyperlipidemia, unspecified: Secondary | ICD-10-CM | POA: Insufficient documentation

## 2020-11-08 DIAGNOSIS — R1013 Epigastric pain: Secondary | ICD-10-CM | POA: Diagnosis present

## 2020-11-08 DIAGNOSIS — Z6830 Body mass index (BMI) 30.0-30.9, adult: Secondary | ICD-10-CM | POA: Insufficient documentation

## 2020-11-08 DIAGNOSIS — R1011 Right upper quadrant pain: Secondary | ICD-10-CM | POA: Diagnosis present

## 2020-11-08 LAB — RESP PANEL BY RT-PCR (FLU A&B, COVID) ARPGX2
Influenza A by PCR: NEGATIVE
Influenza B by PCR: NEGATIVE
SARS Coronavirus 2 by RT PCR: NEGATIVE

## 2020-11-08 SURGERY — LAPAROSCOPIC CHOLECYSTECTOMY WITH INTRAOPERATIVE CHOLANGIOGRAM
Anesthesia: General

## 2020-11-08 MED ORDER — AMOXICILLIN-POT CLAVULANATE 875-125 MG PO TABS: 1.0000 | ORAL_TABLET | Freq: Two times a day (BID) | ORAL | 0 refills | Status: DC

## 2020-11-08 MED ORDER — METOCLOPRAMIDE HCL 5 MG/ML IJ SOLN
10.0000 mg | Freq: Once | INTRAMUSCULAR | Status: AC
  Administered 2020-11-08: 10 mg via INTRAVENOUS
  Filled 2020-11-08: qty 2

## 2020-11-08 MED ORDER — SODIUM CHLORIDE 0.9 % IV SOLN
1.0000 g | Freq: Once | INTRAVENOUS | Status: AC
  Administered 2020-11-08: 1 g via INTRAVENOUS
  Filled 2020-11-08: qty 10

## 2020-11-08 MED ORDER — IOHEXOL 300 MG/ML  SOLN
100.0000 mL | Freq: Once | INTRAMUSCULAR | Status: AC | PRN
  Administered 2020-11-08: 100 mL via INTRAVENOUS

## 2020-11-08 MED ORDER — SODIUM CHLORIDE 0.9 % IV BOLUS
1000.0000 mL | Freq: Once | INTRAVENOUS | Status: AC
  Administered 2020-11-08: 1000 mL via INTRAVENOUS

## 2020-11-08 MED ORDER — AMOXICILLIN-POT CLAVULANATE 875-125 MG PO TABS: 1.0000 | ORAL_TABLET | Freq: Two times a day (BID) | ORAL | 0 refills | Status: AC

## 2020-11-08 NOTE — Discharge Instructions (Addendum)
We suspect that you might have cholelithiasis. You are seen by the general surgeons, they are prescribing some antibiotics and want you to be seen in the clinic in 2 weeks. Call the number provided on Monday afternoon if you do not hear from them with an appointment.  Return to the ER if your pain starts getting worse. Read the instructions provided.

## 2020-11-08 NOTE — Consult Note (Signed)
Reason for Consult:Possible gallbladder disease Referring Physician: Dr. Kathyrn Lass  Maria Schaefer is an 45 y.o. female.  HPI:  This patient presents with episodes of intermittent upper abdominal nausea vomiting over the last several years.  Have been followed by Dr. Juanita Schaefer with [facility] gastroenterology.  Had work-ups including EGD ultrasound and HIDA scan in 2015.  No definite cholecystitis but symptoms and story suggestive with other etiologies ruled out.  Consultation see Dr. Ninfa Schaefer with our group.  He offered cholecystectomy in 2015.  That did not happen.  Patient presents with one day of nausea vomiting, epigastric abdominal pain.  Went to urgent care center the day before and was given Zofran.  Recurrent symptoms and more constant pain.  Concern for Murphy sign and discomfort.  CT scan concerning for possible cholecystitis.  Bedside ultrasound by Dr. Leonette Schaefer concerning for gallbladder wall thickening.  Surgical consultation and transfer requested.  Currently the patient is improved and is requesting outpatient surgery instead of urgent surgery today.  Past Medical History:  Diagnosis Date   AMA (advanced maternal age) multigravida 35+    Anxiety    Depression    tried to harm herself once   Dysrhythmia    PVSs   Hx of varicella    Insomnia    Intentional drug overdose (Maria Schaefer) 06/2011   OD on ambien   Intermittent low back pain     Past Surgical History:  Procedure Laterality Date   NO PAST SURGERIES      Family History  Problem Relation Age of Onset   Hypertension Sister    Diabetes Sister     Social History:  reports that she has never smoked. She has never used smokeless tobacco. She reports current alcohol use. She reports that she does not use drugs.  Allergies:  Allergies  Allergen Reactions   Ciprofloxacin Diarrhea, Other (See Comments) and Nausea And Vomiting    Diarrhea     Medications:  Prior to Admission medications   Medication Sig Start Date End Date  Taking? Authorizing Provider  HYDROcodone-acetaminophen (VICODIN) 5-500 MG per tablet Take 1 tablet by mouth daily as needed. Severe pain 09/17/12   Molli Posey, MD  hydrOXYzine (ATARAX/VISTARIL) 25 MG tablet Take 1 tablet (25 mg total) by mouth 4 (four) times daily as needed for anxiety. 03/28/13   Rankin, Shuvon B, NP  sertraline (ZOLOFT) 100 MG tablet Take 1 tablet (100 mg total) by mouth daily. 03/28/13   Rankin, Shuvon B, NP  zolpidem (AMBIEN CR) 12.5 MG CR tablet Take 12.5 mg by mouth at bedtime as needed for sleep.    [provider]     Results for orders placed or performed during the hospital encounter of 11/08/20 (from the past 48 hour(s))  Lipase, blood     Status: None   Collection Time: 11/07/20 11:20 PM  Result Value Ref Range   Lipase 29 11 - 51 U/L    Comment: Performed at [facility], [address] [place], [place] [address]  Comprehensive metabolic panel     Status: Abnormal   Collection Time: 11/07/20 11:20 PM  Result Value Ref Range   Sodium 135 135 - 145 mmol/L   Potassium 3.7 3.5 - 5.1 mmol/L   Chloride 105 98 - 111 mmol/L   CO2 23 22 - 32 mmol/L   Glucose, Bld 115 (H) 70 - 99 mg/dL    Comment: Glucose reference range applies only to samples taken after fasting for at least 8  hours.   BUN 9 6 - 20 mg/dL   Creatinine, Ser 0.79 0.44 - 1.00 mg/dL   Calcium 8.7 (L) 8.9 - 10.3 mg/dL   Total Protein 7.2 6.5 - 8.1 g/dL   Albumin 3.6 3.5 - 5.0 g/dL   AST 19 15 - 41 U/L   ALT 17 0 - 44 U/L   Alkaline Phosphatase 79 38 - 126 U/L   Total Bilirubin 0.4 0.3 - 1.2 mg/dL   GFR, Estimated >60 >60 mL/min    Comment: (NOTE) Calculated using the CKD-EPI Creatinine Equation (2021)    Anion gap 7 5 - 15    Comment: Performed at [facility], [address] [place], [place] [address]  CBC     Status: Abnormal   Collection Time: 11/07/20 11:20 PM  Result Value Ref Range   WBC 10.8 (H) 4.0 - 10.5 K/uL   RBC 4.50 3.87 - 5.11 MIL/uL    Hemoglobin 13.2 12.0 - 15.0 g/dL   HCT 38.9 36.0 - 46.0 %   MCV 86.4 80.0 - 100.0 fL   MCH 29.3 26.0 - 34.0 pg   MCHC 33.9 30.0 - 36.0 g/dL   RDW 13.0 11.5 - 15.5 %   Platelets 427 (H) 150 - 400 K/uL   nRBC 0.0 0.0 - 0.2 %    Comment: Performed at [facility], [address] [place], [place] [address]  Urinalysis, Routine w reflex microscopic Urine, Clean Catch     Status: Abnormal   Collection Time: 11/07/20 11:20 PM  Result Value Ref Range   Color, Urine YELLOW YELLOW   APPearance HAZY (A) CLEAR   Specific Gravity, Urine >1.030 (H) 1.005 - 1.030   pH 7.0 5.0 - 8.0   Glucose, UA NEGATIVE NEGATIVE mg/dL   Hgb urine dipstick SMALL (A) NEGATIVE   Bilirubin Urine NEGATIVE NEGATIVE   Ketones, ur NEGATIVE NEGATIVE mg/dL   Protein, ur NEGATIVE NEGATIVE mg/dL   Nitrite NEGATIVE NEGATIVE   Leukocytes,Ua TRACE (A) NEGATIVE    Comment: Performed at [facility], [address] [place], [place] [address]  Pregnancy, urine     Status: None   Collection Time: 11/07/20 11:20 PM  Result Value Ref Range   Preg Test, Ur NEGATIVE NEGATIVE    Comment:        THE SENSITIVITY OF THIS METHODOLOGY IS >20 mIU/mL. Performed at [facility], [address] [place], [place] [address]   Urinalysis, Microscopic (reflex)     Status: Abnormal   Collection Time: 11/07/20 11:20 PM  Result Value Ref Range   RBC / HPF 6-10 0 - 5 RBC/hpf   WBC, UA 0-5 0 - 5 WBC/hpf   Bacteria, UA FEW (A) NONE SEEN   Squamous Epithelial / LPF 0-5 0 - 5    Comment: Performed at [facility], [address] [place], [place] [address]  Resp Panel by RT-PCR (Flu A&B, Covid) Nasopharyngeal Swab     Status: None   Collection Time: 11/08/20  6:08 AM   Specimen: Nasopharyngeal Swab; Nasopharyngeal(NP) swabs in vial transport medium  Result Value Ref Range   SARS Coronavirus 2 by RT PCR NEGATIVE NEGATIVE    Comment: (NOTE) SARS-CoV-2 target nucleic acids are NOT DETECTED.  The  SARS-CoV-2 RNA is generally detectable in upper respiratory specimens during the acute phase of infection. The lowest concentration of SARS-CoV-2 viral copies this assay can detect is 138 copies/mL. A negative  result does not preclude SARS-Cov-2 infection and should not be used as the sole basis for treatment or other patient management decisions. A negative result may occur with  improper specimen collection/handling, submission of specimen other than nasopharyngeal swab, presence of viral mutation(s) within the areas targeted by this assay, and inadequate number of viral copies(<138 copies/mL). A negative result must be combined with clinical observations, patient history, and epidemiological information. The expected result is Negative.  Fact Sheet for Patients:  EntrepreneurPulse.com.au  Fact Sheet for Healthcare Providers:  IncredibleEmployment.be  This test is no t yet approved or cleared by the [place] FDA and  has been authorized for detection and/or diagnosis of SARS-CoV-2 by FDA under an Emergency Use Authorization (EUA). This EUA will remain  in effect (meaning this test can be used) for the duration of the COVID-19 declaration under Section 564(b)(1) of the Act, 21 U.S.C.section 360bbb-3(b)(1), unless the authorization is terminated  or revoked sooner.       Influenza A by PCR NEGATIVE NEGATIVE   Influenza B by PCR NEGATIVE NEGATIVE    Comment: (NOTE) The Xpert Xpress SARS-CoV-2/FLU/RSV plus assay is intended as an aid in the diagnosis of influenza from Nasopharyngeal swab specimens and should not be used as a sole basis for treatment. Nasal washings and aspirates are unacceptable for Xpert Xpress SARS-CoV-2/FLU/RSV testing.  Fact Sheet for Patients: EntrepreneurPulse.com.au  Fact Sheet for Healthcare Providers: IncredibleEmployment.be  This test is not yet approved or cleared by the  [place] FDA and has been authorized for detection and/or diagnosis of SARS-CoV-2 by FDA under an Emergency Use Authorization (EUA). This EUA will remain in effect (meaning this test can be used) for the duration of the COVID-19 declaration under Section 564(b)(1) of the Act, 21 U.S.C. section 360bbb-3(b)(1), unless the authorization is terminated or revoked.  Performed at [facility], [address] [place], [place] [address]     CT ABDOMEN PELVIS W CONTRAST  Result Date: 11/08/2020 CLINICAL DATA:  Abdominal pain, nausea/vomiting EXAM: CT ABDOMEN AND PELVIS WITH CONTRAST TECHNIQUE: Multidetector CT imaging of the abdomen and pelvis was performed using the standard protocol following bolus administration of intravenous contrast. CONTRAST:  152mL OMNIPAQUE IOHEXOL 300 MG/ML  SOLN COMPARISON:  None. FINDINGS: Motion degraded images. Lower chest: Lung bases are clear. Hepatobiliary: 13 mm low-density lesion in the posterior right hepatic lobe (series 2/image 12), incompletely evaluated. Additional subcentimeter probable cyst in the right hepatic dome (series 2/image 5). Mild gallbladder wall thickening/edema (series 2/image 27). A noncalcified gallstone is possible in the gallbladder neck (series 2/image 22), but this is poorly evaluated on the current CT. No intrahepatic or extrahepatic ductal dilatation. Pancreas: Within normal limits. Spleen: Within normal limits. Adrenals/Urinary Tract: Adrenal glands are within normal limits. Right upper pole renal cortical scarring. 15 mm lateral right lower pole renal cyst (series 2/image 28), benign. Left kidney is within normal limits. No hydronephrosis. Bladder is within normal limits. Stomach/Bowel: Stomach is within normal limits. No evidence of bowel obstruction. Normal appendix (series 2/image 51). No colonic wall thickening or inflammatory changes. Vascular/Lymphatic: No evidence of abdominal aortic aneurysm. No suspicious abdominopelvic  lymphadenopathy. Reproductive: Uterus is within normal limits. Bilateral ovaries are within normal limits. Other: No abdominopelvic ascites. Tiny fat containing periumbilical hernia. Musculoskeletal: Visualized osseous structures are within normal limits. IMPRESSION: Motion degraded images. Mild gallbladder wall thickening/edema, with equivocal cholelithiasis, poorly evaluated. Consider right upper quadrant ultrasound for further evaluation. Additional ancillary findings as above. Electronically Signed   By:  Julian Hy M.D.   On: 11/08/2020 02:56    Review of Systems  HENT:  Negative for ear discharge, ear pain, hearing loss and tinnitus.   Eyes:  Negative for photophobia and pain.  Respiratory:  Negative for cough and shortness of breath.   Cardiovascular:  Negative for chest pain.  Gastrointestinal:  Positive for abdominal pain and nausea. Negative for vomiting.  Genitourinary:  Negative for dysuria, flank pain, frequency and urgency.  Musculoskeletal:  Negative for back pain, myalgias and neck pain.  Neurological:  Negative for dizziness and headaches.  Hematological:  Does not bruise/bleed easily.  Psychiatric/Behavioral:  The patient is not nervous/anxious.    Blood pressure (!) 119/55, pulse 66, temperature 98.5 F (36.9 C), temperature source Oral, resp. rate 16, height 5\' 7"  (1.702 m), weight 108.9 kg, last menstrual period 10/20/2020, SpO2 97 %. Physical Exam Constitutional:  WDWN in NAD, conversant, no obvious deformities; lying in bed comfortably Eyes:  Pupils equal, round; sclera anicteric; moist conjunctiva; no lid lag HENT:  Oral mucosa moist; good dentition  Neck:  No masses palpated, trachea midline; no thyromegaly Lungs:  CTA bilaterally; normal respiratory effort CV:  Regular rate and rhythm; no murmurs; extremities well-perfused with no edema Abd:  +bowel sounds, soft, mild epigastric tenderness; no palpable organomegaly; no palpable hernias Musc:  Unable to assess  gait; no apparent clubbing or cyanosis in extremities Lymphatic:  No palpable cervical or axillary lymphadenopathy Skin:  Warm, dry; no sign of jaundice Psychiatric - alert and oriented x 4; calm mood and affect  Assessment/Plan: Possible early acute cholecystitis Discharge on abx - early follow-up with me in the office to discuss elective surgery.  Imogene Burn Breanne Olvera 11/08/2020, 9:19 AM

## 2020-11-08 NOTE — ED Provider Notes (Signed)
[facility] EMERGENCY DEPARTMENT Provider Note  CSN: [id number] Arrival date & time: 11/07/20 2300  Chief Complaint(s) Abdominal Pain  HPI Maria Schaefer is a 45 y.o. female    Abdominal Pain Pain location:  Epigastric Pain quality: aching and cramping   Pain radiates to:  Does not radiate Pain severity:  Moderate Onset quality:  Gradual Duration:  2 days Timing:  Constant Progression:  Waxing and waning Chronicity:  New Context: recent illness (COVID several weeks ago)   Context: not alcohol use, not diet changes, not eating, not sick contacts and not suspicious food intake   Relieved by:  Nothing Worsened by:  Eating Associated symptoms: nausea and vomiting   Associated symptoms: no chest pain, no constipation, no cough, no diarrhea, no dysuria, no fatigue, no fever and no shortness of breath    Past Medical History Past Medical History:  Diagnosis Date   AMA (advanced maternal age) multigravida 35+    Anxiety    Depression    tried to harm herself once   Dysrhythmia    PVSs   Hx of varicella    Insomnia    Intentional drug overdose ([place]) 06/2011   OD on ambien   Intermittent low back pain    Patient Active Problem List   Diagnosis Date Noted   Adult BMI 30.0-30.9 kg/sq m 11/08/2020   Other and unspecified hyperlipidemia 65/46/5035   Lichen simplex 46/56/8127   Irritant dermatitis 12/20/2018   Osteoarthritis of right knee 12/01/2018   Osteoarthritis of left knee 12/01/2018   Impingement syndrome of left shoulder region 02/09/2017   Acne vulgaris 02/19/2015   Non-intractable vomiting with nausea 09/26/2013   Right upper quadrant pain 09/26/2013   Chronic cholecystitis 05/29/2013   Major depressive disorder 03/28/2013   Overdose 03/28/2013   Syncope 10/07/2011   Allergic rhinitis 01/07/2011   Epidermoid cyst 01/07/2011   Insomnia 01/07/2011   Chronic low back pain 01/07/2011   Home Medication(s) Prior to Admission medications   Medication Sig  Start Date End Date Taking? Authorizing Provider  HYDROcodone-acetaminophen (VICODIN) 5-500 MG per tablet Take 1 tablet by mouth daily as needed. Severe pain 09/17/12   Molli Posey, MD  hydrOXYzine (ATARAX/VISTARIL) 25 MG tablet Take 1 tablet (25 mg total) by mouth 4 (four) times daily as needed for anxiety. 03/28/13   Rankin, Shuvon B, NP  sertraline (ZOLOFT) 100 MG tablet Take 1 tablet (100 mg total) by mouth daily. 03/28/13   Rankin, Shuvon B, NP  zolpidem (AMBIEN CR) 12.5 MG CR tablet Take 12.5 mg by mouth at bedtime as needed for sleep.    [provider]                                                                                                                                    Past Surgical History Past Surgical History:  Procedure Laterality Date   NO PAST SURGERIES  Family History Family History  Problem Relation Age of Onset   Hypertension Sister    Diabetes Sister     Social History Social History   Tobacco Use   Smoking status: Never   Smokeless tobacco: Never  Substance Use Topics   Alcohol use: Yes    Comment: occ   Drug use: No   Allergies Ciprofloxacin  Review of Systems Review of Systems  Constitutional:  Negative for fatigue and fever.  Respiratory:  Negative for cough and shortness of breath.   Cardiovascular:  Negative for chest pain.  Gastrointestinal:  Positive for abdominal pain, nausea and vomiting. Negative for constipation and diarrhea.  Genitourinary:  Negative for dysuria.  All other systems are reviewed and are negative for acute change except as noted in the HPI  Physical Exam Vital Signs  I have reviewed the triage vital signs BP (!) 142/82 (BP Location: Right Arm)   Pulse 93   Temp 98.8 F (37.1 C) (Oral)   Resp 18   Ht 5\' 7"  (1.702 m)   Wt 108.9 kg   LMP 10/20/2020 (Exact Date)   SpO2 99%   BMI 37.59 kg/m   Physical Exam Vitals reviewed.  Constitutional:      General: She is not in acute distress.     Appearance: She is well-developed. She is not diaphoretic.  HENT:     Head: Normocephalic and atraumatic.     Right Ear: External ear normal.     Left Ear: External ear normal.     Nose: Nose normal.  Eyes:     General: No scleral icterus.    Conjunctiva/sclera: Conjunctivae normal.  Neck:     Trachea: Phonation normal.  Cardiovascular:     Rate and Rhythm: Normal rate and regular rhythm.  Pulmonary:     Effort: Pulmonary effort is normal. No respiratory distress.     Breath sounds: No stridor.  Abdominal:     General: There is no distension.     Tenderness: There is abdominal tenderness in the epigastric area. There is guarding. There is no rebound. Negative signs include Murphy's sign and McBurney's sign.  Musculoskeletal:        General: Normal range of motion.     Cervical back: Normal range of motion.  Neurological:     Mental Status: She is alert and oriented to person, place, and time.  Psychiatric:        Behavior: Behavior normal.    ED Results and Treatments Labs (all labs ordered are listed, but only abnormal results are displayed) Labs Reviewed  COMPREHENSIVE METABOLIC PANEL - Abnormal; Notable for the following components:      Result Value   Glucose, Bld 115 (*)    Calcium 8.7 (*)    All other components within normal limits  CBC - Abnormal; Notable for the following components:   WBC 10.8 (*)    Platelets 427 (*)    All other components within normal limits  URINALYSIS, ROUTINE W REFLEX MICROSCOPIC - Abnormal; Notable for the following components:   APPearance HAZY (*)    Specific Gravity, Urine >1.030 (*)    Hgb urine dipstick SMALL (*)    Leukocytes,Ua TRACE (*)    All other components within normal limits  URINALYSIS, MICROSCOPIC (REFLEX) - Abnormal; Notable for the following components:   Bacteria, UA FEW (*)    All other components within normal limits  RESP PANEL BY RT-PCR (FLU A&B, COVID) ARPGX2  LIPASE, BLOOD  PREGNANCY, URINE  EKG  EKG Interpretation  Date/Time:  Friday November 07 2020 23:14:02 EDT Ventricular Rate:  101 PR Interval:  142 QRS Duration: 68 QT Interval:  328 QTC Calculation: 425 R Axis:   89 Text Interpretation: Sinus tachycardia Right atrial enlargement Borderline ECG Confirmed by Addison Lank [phone]) on 11/08/2020 1:33:25 AM       Radiology CT ABDOMEN PELVIS W CONTRAST  Result Date: 11/08/2020 CLINICAL DATA:  Abdominal pain, nausea/vomiting EXAM: CT ABDOMEN AND PELVIS WITH CONTRAST TECHNIQUE: Multidetector CT imaging of the abdomen and pelvis was performed using the standard protocol following bolus administration of intravenous contrast. CONTRAST:  171mL OMNIPAQUE IOHEXOL 300 MG/ML  SOLN COMPARISON:  None. FINDINGS: Motion degraded images. Lower chest: Lung bases are clear. Hepatobiliary: 13 mm low-density lesion in the posterior right hepatic lobe (series 2/image 12), incompletely evaluated. Additional subcentimeter probable cyst in the right hepatic dome (series 2/image 5). Mild gallbladder wall thickening/edema (series 2/image 27). A noncalcified gallstone is possible in the gallbladder neck (series 2/image 22), but this is poorly evaluated on the current CT. No intrahepatic or extrahepatic ductal dilatation. Pancreas: Within normal limits. Spleen: Within normal limits. Adrenals/Urinary Tract: Adrenal glands are within normal limits. Right upper pole renal cortical scarring. 15 mm lateral right lower pole renal cyst (series 2/image 28), benign. Left kidney is within normal limits. No hydronephrosis. Bladder is within normal limits. Stomach/Bowel: Stomach is within normal limits. No evidence of bowel obstruction. Normal appendix (series 2/image 51). No colonic wall thickening or inflammatory changes. Vascular/Lymphatic: No evidence of abdominal aortic aneurysm. No suspicious abdominopelvic  lymphadenopathy. Reproductive: Uterus is within normal limits. Bilateral ovaries are within normal limits. Other: No abdominopelvic ascites. Tiny fat containing periumbilical hernia. Musculoskeletal: Visualized osseous structures are within normal limits. IMPRESSION: Motion degraded images. Mild gallbladder wall thickening/edema, with equivocal cholelithiasis, poorly evaluated. Consider right upper quadrant ultrasound for further evaluation. Additional ancillary findings as above. Electronically Signed   By: Julian Hy M.D.   On: 11/08/2020 02:56    Pertinent labs & imaging results that were available during my care of the patient were reviewed by me and considered in my medical decision making (see MDM for details).  Medications Ordered in ED Medications  sodium chloride 0.9 % bolus 1,000 mL (0 mLs Intravenous Stopped 11/08/20 0413)  metoCLOPramide (REGLAN) injection 10 mg (10 mg Intravenous Given 11/08/20 0213)  iohexol (OMNIPAQUE) 300 MG/ML solution 100 mL (100 mLs Intravenous Contrast Given 11/08/20 0219)  cefTRIAXone (ROCEPHIN) 1 g in sodium chloride 0.9 % 100 mL IVPB (0 g Intravenous Stopped 11/08/20 0449)                                                                                                                                     Procedures Ultrasound ED Abd  Date/Time: 11/08/2020 2:18 AM Performed by: Fatima Blank, MD Authorized by: Fatima Blank, MD   Procedure details:    Indications: abdominal pain  Assessment for:  Gallstones   Hepatobiliary:  Visualized        Hepatobiliary findings:    Common bile duct:  Normal   Gallbladder wall normal: GB fundus wall WNL. possible wall thickening of the GB neck region.   Gallbladder stones: not identified     Intra-abdominal fluid: not identified     Sonographic Murphy's sign: negative    (including critical care time)  Medical Decision Making / ED Course I have reviewed the nursing notes for this  encounter and the patient's prior records (if available in EHR or on provided paperwork).  Lilyrose Tanney was evaluated in Emergency Department on 11/08/2020 for the symptoms described in the history of present illness. She was evaluated in the context of the global COVID-19 pandemic, which necessitated consideration that the patient might be at risk for infection with the SARS-CoV-2 virus that causes COVID-19. Institutional protocols and algorithms that pertain to the evaluation of patients at risk for COVID-19 are in a state of rapid change based on information released by regulatory bodies including the CDC and federal and state organizations. These policies and algorithms were followed during the patient's care in the ED.     Epigastric abdominal pain associated with postprandial exacerbation and emesis. Epigastric tenderness palpation. Considering gastritis, peptic ulcer disease, pancreatitis, biliary disease. Screening labs obtain.  Pertinent labs & imaging results that were available during my care of the patient were reviewed by me and considered in my medical decision making:  Notable for leukocytosis.  No anemia.  No significant electrolyte derangement or renal sufficiency.  No evidence of bili obstruction or pancreatitis.. Bedside ultrasound not convincing for acute cholecystitis, but there is possible wall thickening at the neck. UA without evidence of infection.  Will obtain a CT scan to better assess and rule out other serious intra-abdominal inflammatory/infectious process. UPT needed for CT scan and negative.  Treated with antiemetics, IV fluids.  CT notable for GB wall edema concerning for possible cholecystitis. Gave patient rocephin.  Will need formal US to confirm. Spoke with Dr. Johney Maine. Will have team assess. Dr. Ralene Bathe will await transfer to [facility] ED.  Final Clinical Impression(s) / ED Diagnoses Final diagnoses:  Epigastric pain     This chart was dictated using voice  recognition software.  Despite best efforts to proofread,  errors can occur which can change the documentation meaning.    Fatima Blank, MD 11/08/20 [phone]

## 2020-11-08 NOTE — ED Provider Notes (Signed)
  Physical Exam  BP (!) 119/55 (BP Location: Right Arm)   Pulse 66   Temp 98.5 F (36.9 C) (Oral)   Resp 16   Ht 5\' 7"  (1.702 m)   Wt 108.9 kg   LMP 10/20/2020 (Exact Date)   SpO2 97%   BMI 37.59 kg/m   Physical Exam  ED Course/Procedures     Procedures  MDM  Pt sent to ED from [facility].  Surgery has been consulted.  9:44 AM Dr. Georgette Dover has seen the patient. No clinical cholecystitis and pt is feeling better and is amenable to outpatient f/u. He has discussed ER return precautions.        Varney Biles, MD 11/08/20 [phone]

## 2020-11-17 ENCOUNTER — Ambulatory Visit: Payer: Self-pay | Admitting: Surgery

## 2020-11-17 NOTE — H&P (View-Only) (Signed)
Subjective   Chief Complaint: Gallbladder disease   History of Present Illness: Maria Schaefer is a 45 y.o. female who is seen today as an office consultation at the request of Dr. Sabra Heck for evaluation of gallbladder disease.     This patient presents with episodes of intermittent upper abdominal nausea vomiting over the last several years.  Have been followed by Dr. Juanita Craver with [facility] gastroenterology.  Had work-ups including EGD ultrasound and HIDA scan in 2015.  No definite cholecystitis but symptoms and story suggestive with other etiologies ruled out.  Consultation see Dr. Ninfa Linden with our group.  He offered cholecystectomy in 2015.  That did not happen.  Patient presents with one day of nausea vomiting, epigastric abdominal pain.  Went to urgent care center the day before and was given Zofran.  Recurrent symptoms and more constant pain.  Concern for Murphy sign and discomfort.  CT scan concerning for possible cholecystitis.  Bedside ultrasound by Dr. Leonette Monarch concerning for gallbladder wall thickening.  We saw her in the emergency department on 10/29.  Her symptoms had improved slightly.  She was discharged home on oral antibiotics with instructions for close follow-up.  She continues to have some mild discomfort but is not having severe pain.  Review of Systems: A complete review of systems was obtained from the patient.  I have reviewed this information and discussed as appropriate with the patient.  See HPI as well for other ROS.  Review of Systems  Constitutional: Negative.   HENT: Negative.   Eyes: Negative.   Respiratory: Negative.   Cardiovascular: Negative.   Gastrointestinal: Positive for abdominal pain, diarrhea, nausea and vomiting.  Genitourinary: Negative.   Musculoskeletal: Positive for back pain.  Skin: Negative.   Neurological: Negative.   Endo/Heme/Allergies: Negative.   Psychiatric/Behavioral: Negative.       Medical History: Past Medical History:   Diagnosis Date   Anxiety    Hypertension     Patient Active Problem List  Diagnosis   Class 2 obesity due to excess calories without serious comorbidity with body mass index (BMI) of 36.0 to 36.9 in adult   Chronic cholecystitis   Depression   Insomnia    History reviewed. No pertinent surgical history.   No Known Allergies  Current Outpatient Medications on File Prior to Visit  Medication Sig Dispense Refill   meloxicam (MOBIC) 15 MG tablet meloxicam 15 mg tablet     norethindrone-ethinyl estradiol (JUNEL FE 1/20, 28,) 1 mg-20 mcg (21)/75 mg (7) tablet Junel FE 1/20 (28) 1 mg-20 mcg (21)/75 mg (7) tablet     traZODone (DESYREL) 50 MG tablet      zolpidem (AMBIEN CR) 12.5 MG CR tablet Ambien CR     No current facility-administered medications on file prior to visit.    Family History  Problem Relation Age of Onset   High blood pressure (Hypertension) Sister      Social History   Tobacco Use  Smoking Status Never  Smokeless Tobacco Never     Social History   Socioeconomic History   Marital status: Single  Tobacco Use   Smoking status: Never   Smokeless tobacco: Never  Substance and Sexual Activity   Alcohol use: Yes    Comment: rarely   Drug use: Never    Objective:    Vitals:   11/17/20 0853  BP: 136/80  Pulse: 98  Temp: 37 C (98.6 F)  SpO2: 99%  Weight: (!) 108.5 kg (239 lb 3.2 oz)  Height:  167.6 cm (5\' 6" )    Body mass index is 38.61 kg/m.  Physical Exam   Constitutional:  WDWN in NAD, conversant, no obvious deformities; lying in bed comfortably Eyes:  Pupils equal, round; sclera anicteric; moist conjunctiva; no lid lag HENT:  Oral mucosa moist; good dentition  Neck:  No masses palpated, trachea midline; no thyromegaly Lungs:  CTA bilaterally; normal respiratory effort CV:  Regular rate and rhythm; no murmurs; extremities well-perfused with no edema Abd:  +bowel sounds, soft, mild epigastric tenderness; no palpable organomegaly; no  palpable hernias Musc:  Unable to assess gait; no apparent clubbing or cyanosis in extremities Lymphatic:  No palpable cervical or axillary lymphadenopathy Skin:  Warm, dry; no sign of jaundice Psychiatric - alert and oriented x 4; calm mood and affect   Labs, Imaging and Diagnostic Testing: CLINICAL DATA:  Abdominal pain, nausea/vomiting   EXAM: CT ABDOMEN AND PELVIS WITH CONTRAST   TECHNIQUE: Multidetector CT imaging of the abdomen and pelvis was performed using the standard protocol following bolus administration of intravenous contrast.   CONTRAST:  134mL OMNIPAQUE IOHEXOL 300 MG/ML  SOLN   COMPARISON:  None.   FINDINGS: Motion degraded images.   Lower chest: Lung bases are clear.   Hepatobiliary: 13 mm low-density lesion in the posterior right hepatic lobe (series 2/image 12), incompletely evaluated. Additional subcentimeter probable cyst in the right hepatic dome (series 2/image 5).   Mild gallbladder wall thickening/edema (series 2/image 27). A noncalcified gallstone is possible in the gallbladder neck (series 2/image 22), but this is poorly evaluated on the current CT. No intrahepatic or extrahepatic ductal dilatation.   Pancreas: Within normal limits.   Spleen: Within normal limits.   Adrenals/Urinary Tract: Adrenal glands are within normal limits.   Right upper pole renal cortical scarring. 15 mm lateral right lower pole renal cyst (series 2/image 28), benign. Left kidney is within normal limits. No hydronephrosis.   Bladder is within normal limits.   Stomach/Bowel: Stomach is within normal limits.   No evidence of bowel obstruction.   Normal appendix (series 2/image 51).   No colonic wall thickening or inflammatory changes.   Vascular/Lymphatic: No evidence of abdominal aortic aneurysm.   No suspicious abdominopelvic lymphadenopathy.   Reproductive: Uterus is within normal limits.   Bilateral ovaries are within normal limits.   Other: No  abdominopelvic ascites.   Tiny fat containing periumbilical hernia.   Musculoskeletal: Visualized osseous structures are within normal limits.   IMPRESSION: Motion degraded images.   Mild gallbladder wall thickening/edema, with equivocal cholelithiasis, poorly evaluated. Consider right upper quadrant ultrasound for further evaluation.   Additional ancillary findings as above.     Electronically Signed   By: Julian Hy M.D.  Assessment and Plan:  Diagnoses and all orders for this visit:  Chronic cholecystitis    Laparoscopic cholecystectomy with intraoperative cholangiogram.The surgical procedure has been discussed with the patient.  Potential risks, benefits, alternative treatments, and expected outcomes have been explained.  All of the patient's questions at this time have been answered.  The likelihood of reaching the patient's treatment goal is good.  The patient understand the proposed surgical procedure and wishes to proceed.   No follow-ups on file.  Preslie Depasquale Jearld Adjutant, MD  11/17/2020 9:09 AM

## 2020-11-17 NOTE — H&P (Signed)
Subjective   Chief Complaint: Gallbladder disease   History of Present Illness: Maria Schaefer is a 45 y.o. female who is seen today as an office consultation at the request of Dr. Sabra Heck for evaluation of gallbladder disease.     This patient presents with episodes of intermittent upper abdominal nausea vomiting over the last several years.  Have been followed by Dr. Juanita Craver with [facility] gastroenterology.  Had work-ups including EGD ultrasound and HIDA scan in 2015.  No definite cholecystitis but symptoms and story suggestive with other etiologies ruled out.  Consultation see Dr. Ninfa Linden with our group.  He offered cholecystectomy in 2015.  That did not happen.  Patient presents with one day of nausea vomiting, epigastric abdominal pain.  Went to urgent care center the day before and was given Zofran.  Recurrent symptoms and more constant pain.  Concern for Murphy sign and discomfort.  CT scan concerning for possible cholecystitis.  Bedside ultrasound by Dr. Leonette Monarch concerning for gallbladder wall thickening.  We saw her in the emergency department on 10/29.  Her symptoms had improved slightly.  She was discharged home on oral antibiotics with instructions for close follow-up.  She continues to have some mild discomfort but is not having severe pain.  Review of Systems: A complete review of systems was obtained from the patient.  I have reviewed this information and discussed as appropriate with the patient.  See HPI as well for other ROS.  Review of Systems  Constitutional: Negative.   HENT: Negative.   Eyes: Negative.   Respiratory: Negative.   Cardiovascular: Negative.   Gastrointestinal: Positive for abdominal pain, diarrhea, nausea and vomiting.  Genitourinary: Negative.   Musculoskeletal: Positive for back pain.  Skin: Negative.   Neurological: Negative.   Endo/Heme/Allergies: Negative.   Psychiatric/Behavioral: Negative.       Medical History: Past Medical History:   Diagnosis Date   Anxiety    Hypertension     Patient Active Problem List  Diagnosis   Class 2 obesity due to excess calories without serious comorbidity with body mass index (BMI) of 36.0 to 36.9 in adult   Chronic cholecystitis   Depression   Insomnia    History reviewed. No pertinent surgical history.   No Known Allergies  Current Outpatient Medications on File Prior to Visit  Medication Sig Dispense Refill   meloxicam (MOBIC) 15 MG tablet meloxicam 15 mg tablet     norethindrone-ethinyl estradiol (JUNEL FE 1/20, 28,) 1 mg-20 mcg (21)/75 mg (7) tablet Junel FE 1/20 (28) 1 mg-20 mcg (21)/75 mg (7) tablet     traZODone (DESYREL) 50 MG tablet      zolpidem (AMBIEN CR) 12.5 MG CR tablet Ambien CR     No current facility-administered medications on file prior to visit.    Family History  Problem Relation Age of Onset   High blood pressure (Hypertension) Sister      Social History   Tobacco Use  Smoking Status Never  Smokeless Tobacco Never     Social History   Socioeconomic History   Marital status: Single  Tobacco Use   Smoking status: Never   Smokeless tobacco: Never  Substance and Sexual Activity   Alcohol use: Yes    Comment: rarely   Drug use: Never    Objective:    Vitals:   11/17/20 0853  BP: 136/80  Pulse: 98  Temp: 37 C (98.6 F)  SpO2: 99%  Weight: (!) 108.5 kg (239 lb 3.2 oz)  Height:  167.6 cm (5\' 6" )    Body mass index is 38.61 kg/m.  Physical Exam   Constitutional:  WDWN in NAD, conversant, no obvious deformities; lying in bed comfortably Eyes:  Pupils equal, round; sclera anicteric; moist conjunctiva; no lid lag HENT:  Oral mucosa moist; good dentition  Neck:  No masses palpated, trachea midline; no thyromegaly Lungs:  CTA bilaterally; normal respiratory effort CV:  Regular rate and rhythm; no murmurs; extremities well-perfused with no edema Abd:  +bowel sounds, soft, mild epigastric tenderness; no palpable organomegaly; no  palpable hernias Musc:  Unable to assess gait; no apparent clubbing or cyanosis in extremities Lymphatic:  No palpable cervical or axillary lymphadenopathy Skin:  Warm, dry; no sign of jaundice Psychiatric - alert and oriented x 4; calm mood and affect   Labs, Imaging and Diagnostic Testing: CLINICAL DATA:  Abdominal pain, nausea/vomiting   EXAM: CT ABDOMEN AND PELVIS WITH CONTRAST   TECHNIQUE: Multidetector CT imaging of the abdomen and pelvis was performed using the standard protocol following bolus administration of intravenous contrast.   CONTRAST:  173mL OMNIPAQUE IOHEXOL 300 MG/ML  SOLN   COMPARISON:  None.   FINDINGS: Motion degraded images.   Lower chest: Lung bases are clear.   Hepatobiliary: 13 mm low-density lesion in the posterior right hepatic lobe (series 2/image 12), incompletely evaluated. Additional subcentimeter probable cyst in the right hepatic dome (series 2/image 5).   Mild gallbladder wall thickening/edema (series 2/image 27). A noncalcified gallstone is possible in the gallbladder neck (series 2/image 22), but this is poorly evaluated on the current CT. No intrahepatic or extrahepatic ductal dilatation.   Pancreas: Within normal limits.   Spleen: Within normal limits.   Adrenals/Urinary Tract: Adrenal glands are within normal limits.   Right upper pole renal cortical scarring. 15 mm lateral right lower pole renal cyst (series 2/image 28), benign. Left kidney is within normal limits. No hydronephrosis.   Bladder is within normal limits.   Stomach/Bowel: Stomach is within normal limits.   No evidence of bowel obstruction.   Normal appendix (series 2/image 51).   No colonic wall thickening or inflammatory changes.   Vascular/Lymphatic: No evidence of abdominal aortic aneurysm.   No suspicious abdominopelvic lymphadenopathy.   Reproductive: Uterus is within normal limits.   Bilateral ovaries are within normal limits.   Other: No  abdominopelvic ascites.   Tiny fat containing periumbilical hernia.   Musculoskeletal: Visualized osseous structures are within normal limits.   IMPRESSION: Motion degraded images.   Mild gallbladder wall thickening/edema, with equivocal cholelithiasis, poorly evaluated. Consider right upper quadrant ultrasound for further evaluation.   Additional ancillary findings as above.     Electronically Signed   By: Julian Hy M.D.  Assessment and Plan:  Diagnoses and all orders for this visit:  Chronic cholecystitis    Laparoscopic cholecystectomy with intraoperative cholangiogram.The surgical procedure has been discussed with the patient.  Potential risks, benefits, alternative treatments, and expected outcomes have been explained.  All of the patient's questions at this time have been answered.  The likelihood of reaching the patient's treatment goal is good.  The patient understand the proposed surgical procedure and wishes to proceed.   No follow-ups on file.  Shonia Skilling Jearld Adjutant, MD  11/17/2020 9:09 AM

## 2020-11-23 ENCOUNTER — Encounter (HOSPITAL_COMMUNITY): Payer: Self-pay | Admitting: Surgery

## 2020-11-23 NOTE — Progress Notes (Signed)
PCP: Milas Kocher @ [facility] in [employer] Pt. Went to [facility] in [place] 11/05/20 for tachycardia, stated they feft like it was related to gallbladder. Stated to follow up with her primary doctor. Pt. Stated she saw Dr. Sabra Heck and felt no further testing was needed. No more episodes have occurred since then.  PCP - Environmental manager Cardiologist - na EKG - 11/11/20 Chest x-ray - na ECHO - na Cardiac Cath - na CPAP - na   Blood Thinner Instructions: na Aspirin Instructions: na  ERAS Protcol - yes  COVID TEST- na  Anesthesia review: yes, see above note  -------------  SDW INSTRUCTIONS:  Your procedure is scheduled on 11/15. Please report to [facility] Main Entrance "A" at 7:00 A.M., and check in at the Admitting office. Call this number if you have problems the morning of surgery: [phone]   Remember: Do not eat after midnight the night before your surgery  You may drink clear liquids until 0630 the morning of your surgery.   Clear liquids allowed are: Water, Non-Citrus Juices (without pulp), Carbonated Beverages, Clear Tea, Black Coffee Only, and Gatorade   Medications to take morning of surgery with a sip of water include:  Wellbutrin, junel  As of today, STOP taking any Aspirin (unless otherwise instructed by your surgeon), Aleve, Naproxen, Ibuprofen, Motrin, Advil, Goody's, BC's, all herbal medications, fish oil, and all vitamins.    The Morning of Surgery Do not wear jewelry, make-up or nail polish. Do not wear lotions, powders, or perfumes/colognes, or deodorant Do not shave 48 hours prior to surgery.   Men may shave face and neck. Do not bring valuables to the hospital. [facility] is not responsible for any belongings or valuables.  If you are a smoker, DO NOT Smoke 24 hours prior to surgery  If you wear a CPAP at night please bring your mask the morning of surgery   Remember that you must have someone to transport you  home after your surgery, and remain with you for 24 hours if you are discharged the same day.  Please bring cases for contacts, glasses, hearing aids, dentures or bridgework because it cannot be worn into surgery.   Patients discharged the day of surgery will not be allowed to drive home.   Please shower the NIGHT BEFORE/MORNING OF SURGERY (use antibacterial soap like DIAL soap if possible). Wear comfortable clothes the morning of surgery. Oral Hygiene is also important to reduce your risk of infection.  Remember - BRUSH YOUR TEETH THE MORNING OF SURGERY WITH YOUR REGULAR TOOTHPASTE  Patient denies shortness of breath, fever, cough and chest pain.

## 2020-11-24 NOTE — Progress Notes (Signed)
Anesthesia Chart Review: Maria Schaefer  Case: [id number] Date/Time: 11/25/20 0915   Procedure: LAPAROSCOPIC CHOLECYSTECTOMY WITH INTRAOPERATIVE CHOLANGIOGRAM   Anesthesia type: General   Pre-op diagnosis: CHRONIC CHOLECYSTITIS   Location: [place] OR ROOM 01 / [place] OR   Surgeons: Donnie Mesa, MD       DISCUSSION: Patient is a 45 year old female scheduled for the above procedure. Offered cholecystectomy 2015, but did not happen. ED visit 11/07/20-11/08/20 for N/V/abd pain with concern for [facility] sign and gallbladder thickening. Seen by Dr. Georgette Dover, and discharged on antibiotics for possible early acute cholecystitis with close follow-up to discuss future cholecystectomy.    History includes never smoker, GERD, PVCs (2013), depression (with history intentional OD 2013), anxiety, obesity.  - [facility] ED visit as above.  - [facility] visit 11/05/20 for tachycardia. Reportedly she said her HR got up to 172. HR was 112 with UC vitals, BP 113/78. By notes, EKG showed ST without acute changes and reassurance given. Reportedly had COVID ~ one month prior. She reported follow-up with Dr. Sabra Heck. Tachycardia possibly related to gallbladder issues. Denied recurrent symptoms.   Anesthesia team to evaluate on the day of surgery.  She had labs on 11/07/2020.  Last urine pregnancy test 11/07/2020.   VS: 11/17/20 at [facility]:  Filed Vital Signs Vital Sign Reading Time Taken Comments  Blood Pressure 136/80 11/17/2020 8:53 AM EST    Pulse 98 11/17/2020 8:53 AM EST    Temperature 37 C (98.6 F) 11/17/2020 8:53 AM EST    Respiratory Rate - -    Oxygen Saturation 99% 11/17/2020 8:53 AM EST    Inhaled Oxygen Concentration - -    Weight 108.5 kg (239 lb 3.2 oz) 11/17/2020 8:53 AM EST    Height 167.6 cm (5\' 6" ) 11/17/2020 8:53 AM EST    Body Mass Index 38.61 11/17/2020 8:53 AM EST      PROVIDERS: Kathyrn Lass, MD is PCP [facility]) Juanita Craver, MD is GI   LABS: She had labs on 11/07/20,  results included: Lab Results  Component Value Date   WBC 10.8 (H) 11/07/2020   HGB 13.2 11/07/2020   HCT 38.9 11/07/2020   PLT 427 (H) 11/07/2020   GLUCOSE 115 (H) 11/07/2020   ALT 17 11/07/2020   AST 19 11/07/2020   NA 135 11/07/2020   K 3.7 11/07/2020   CL 105 11/07/2020   CREATININE 0.79 11/07/2020   BUN 9 11/07/2020   CO2 23 11/07/2020   INR 0.96 03/27/2013  Urine pregnancy test negative on 11/07/20.    IMAGES: CT Abd/pelvis 11/08/20: IMPRESSION: - Motion degraded images. - Mild gallbladder wall thickening/edema, with equivocal cholelithiasis, poorly evaluated. Consider right upper quadrant ultrasound for further evaluation. - Additional ancillary findings as above. [See full report]    EKG: Sinus tachycardia at 101 bpm Right atrial enlargement Borderline ECG Confirmed by Addison Lank [phone]) on 11/08/2020 1:33:25 AM - Non-specific T wave abnormality more apparent when compared to 03/27/13 tracing.   CV: 48 Hour Holter monitor 07/21/11 ([facility]): Narrative:  SINUS RHYTHM WITH VERY FREQUENT PREMATURE VENTRICULAR COMPLEXES . NO DIARY RETURNED.    Past Medical History:  Diagnosis Date   AMA (advanced maternal age) multigravida 35+    Anxiety    Depression    tried to harm herself once   Dysrhythmia    PVSs   GERD (gastroesophageal reflux disease)    Hx of varicella    Insomnia    Intentional drug overdose ([place]) 06/12/2011  OD on ambien   Intermittent low back pain     Past Surgical History:  Procedure Laterality Date   NO PAST SURGERIES      MEDICATIONS: No current facility-administered medications for this encounter.    buPROPion (WELLBUTRIN XL) 150 MG 24 hr tablet   cyclobenzaprine (FLEXERIL) 10 MG tablet   fluticasone (FLONASE) 50 MCG/ACT nasal spray   JUNEL FE 1/20 1-20 MG-MCG tablet   meloxicam (MOBIC) 15 MG tablet   montelukast (SINGULAIR) 10 MG tablet   traZODone (DESYREL) 50 MG tablet   zolpidem (AMBIEN CR) 12.5 MG CR tablet     Myra Gianotti, PA-C Surgical Short Stay/Anesthesiology [facility] Phone [phone] [facility] Phone [phone] 11/24/2020 11:51 AM

## 2020-11-24 NOTE — Anesthesia Preprocedure Evaluation (Addendum)
Anesthesia Evaluation  Patient identified by MRN, date of birth, ID band Patient awake    Reviewed: Allergy & Precautions, NPO status , Patient's Chart, lab work & pertinent test results  Airway Mallampati: II  TM Distance: >3 FB Neck ROM: Full    Dental  (+) Teeth Intact, Dental Advisory Given, Caps   Pulmonary neg pulmonary ROS,    Pulmonary exam normal breath sounds clear to auscultation       Cardiovascular (-) anginaNormal cardiovascular exam+ dysrhythmias (PVCs)  Rhythm:Regular Rate:Normal     Neuro/Psych PSYCHIATRIC DISORDERS Anxiety Depression negative neurological ROS     GI/Hepatic GERD  ,CHRONIC CHOLECYSTITIS   Endo/Other  Obesity   Renal/GU negative Renal ROS     Musculoskeletal  (+) Arthritis ,   Abdominal   Peds  Hematology negative hematology ROS (+)   Anesthesia Other Findings Day of surgery medications reviewed with the patient.  Reproductive/Obstetrics negative OB ROS                            Anesthesia Physical Anesthesia Plan  ASA: 2  Anesthesia Plan: General   Post-op Pain Management:    Induction: Intravenous  PONV Risk Score and Plan: 4 or greater and Midazolam, Dexamethasone, Ondansetron and Scopolamine patch - Pre-op  Airway Management Planned: Oral ETT  Additional Equipment:   Intra-op Plan:   Post-operative Plan: Extubation in OR  Informed Consent: I have reviewed the patients History and Physical, chart, labs and discussed the procedure including the risks, benefits and alternatives for the proposed anesthesia with the patient or authorized representative who has indicated his/her understanding and acceptance.     Dental advisory given  Plan Discussed with: CRNA  Anesthesia Plan Comments: (PAT note written 11/24/2020 by Myra Gianotti, PA-C. )       Anesthesia Quick Evaluation

## 2020-11-25 ENCOUNTER — Ambulatory Visit (HOSPITAL_COMMUNITY): Payer: 59 | Admitting: Vascular Surgery

## 2020-11-25 ENCOUNTER — Encounter (HOSPITAL_COMMUNITY)
Admission: RE | Disposition: A | Discharge status: Home / Self Care | Payer: Self-pay | Source: Ambulatory Visit | Attending: Surgery

## 2020-11-25 ENCOUNTER — Ambulatory Visit (HOSPITAL_COMMUNITY): Payer: 59

## 2020-11-25 ENCOUNTER — Ambulatory Visit (HOSPITAL_COMMUNITY)
Admission: RE | Admit: 2020-11-25 | Discharge: 2020-11-25 | Disposition: A | Discharge status: Home / Self Care | Payer: 59 | Source: Ambulatory Visit | Attending: Surgery | Admitting: Surgery

## 2020-11-25 ENCOUNTER — Encounter (HOSPITAL_COMMUNITY): Payer: Self-pay | Admitting: Surgery

## 2020-11-25 DIAGNOSIS — E669 Obesity, unspecified: Secondary | ICD-10-CM | POA: Insufficient documentation

## 2020-11-25 DIAGNOSIS — Z6838 Body mass index (BMI) 38.0-38.9, adult: Secondary | ICD-10-CM | POA: Insufficient documentation

## 2020-11-25 DIAGNOSIS — K219 Gastro-esophageal reflux disease without esophagitis: Secondary | ICD-10-CM | POA: Insufficient documentation

## 2020-11-25 DIAGNOSIS — K8010 Calculus of gallbladder with chronic cholecystitis without obstruction: Secondary | ICD-10-CM | POA: Insufficient documentation

## 2020-11-25 DIAGNOSIS — Z419 Encounter for procedure for purposes other than remedying health state, unspecified: Secondary | ICD-10-CM

## 2020-11-25 HISTORY — DX: Gastro-esophageal reflux disease without esophagitis: K21.9

## 2020-11-25 HISTORY — PX: CHOLECYSTECTOMY: SHX55

## 2020-11-25 LAB — CBC
HCT: 36.8 % (ref 36.0–46.0)
Hemoglobin: 12.2 g/dL (ref 12.0–15.0)
MCH: 29.5 pg (ref 26.0–34.0)
MCHC: 33.2 g/dL (ref 30.0–36.0)
MCV: 88.9 fL (ref 80.0–100.0)
Platelets: 398 10*3/uL (ref 150–400)
RBC: 4.14 MIL/uL (ref 3.87–5.11)
RDW: 12.9 % (ref 11.5–15.5)
WBC: 6.7 10*3/uL (ref 4.0–10.5)
nRBC: 0 % (ref 0.0–0.2)

## 2020-11-25 LAB — POCT PREGNANCY, URINE: Preg Test, Ur: NEGATIVE

## 2020-11-25 SURGERY — LAPAROSCOPIC CHOLECYSTECTOMY WITH INTRAOPERATIVE CHOLANGIOGRAM
Anesthesia: General | Site: Abdomen

## 2020-11-25 MED ORDER — CHLORHEXIDINE GLUCONATE 0.12 % MT SOLN
15.0000 mL | OROMUCOSAL | Status: AC
  Filled 2020-11-25: qty 15

## 2020-11-25 MED ORDER — MIDAZOLAM HCL 2 MG/2ML IJ SOLN
1.0000 mg | Freq: Once | INTRAMUSCULAR | Status: AC
  Administered 2020-11-25: 1 mg via INTRAVENOUS

## 2020-11-25 MED ORDER — HYDROMORPHONE HCL 1 MG/ML IJ SOLN
INTRAMUSCULAR | Status: AC
  Filled 2020-11-25: qty 0.5

## 2020-11-25 MED ORDER — CHLORHEXIDINE GLUCONATE CLOTH 2 % EX PADS: 6.0000 | MEDICATED_PAD | Freq: Once | CUTANEOUS | Status: DC

## 2020-11-25 MED ORDER — SODIUM CHLORIDE 0.9 % IV SOLN
INTRAVENOUS | Status: DC | PRN
  Administered 2020-11-25: 8 mL

## 2020-11-25 MED ORDER — 0.9 % SODIUM CHLORIDE (POUR BTL) OPTIME
TOPICAL | Status: DC | PRN
  Administered 2020-11-25: 1000 mL

## 2020-11-25 MED ORDER — HYDROMORPHONE HCL 1 MG/ML IJ SOLN
INTRAMUSCULAR | Status: DC | PRN
  Administered 2020-11-25: .5 mg via INTRAVENOUS

## 2020-11-25 MED ORDER — MIDAZOLAM HCL 5 MG/5ML IJ SOLN
INTRAMUSCULAR | Status: DC | PRN
  Administered 2020-11-25: 2 mg via INTRAVENOUS

## 2020-11-25 MED ORDER — CHLORHEXIDINE GLUCONATE 0.12 % MT SOLN
OROMUCOSAL | Status: AC
  Administered 2020-11-25: 15 mL via OROMUCOSAL
  Filled 2020-11-25: qty 15

## 2020-11-25 MED ORDER — FENTANYL CITRATE (PF) 250 MCG/5ML IJ SOLN
INTRAMUSCULAR | Status: AC
  Filled 2020-11-25: qty 5

## 2020-11-25 MED ORDER — SODIUM CHLORIDE 0.9 % IR SOLN
Status: DC | PRN
  Administered 2020-11-25: 1000 mL

## 2020-11-25 MED ORDER — ONDANSETRON HCL 4 MG/2ML IJ SOLN
INTRAMUSCULAR | Status: AC
  Filled 2020-11-25: qty 2

## 2020-11-25 MED ORDER — PROMETHAZINE HCL 25 MG/ML IJ SOLN: 6.2500 mg | INTRAMUSCULAR | Status: DC | PRN

## 2020-11-25 MED ORDER — BUPIVACAINE-EPINEPHRINE (PF) 0.25% -1:200000 IJ SOLN
INTRAMUSCULAR | Status: AC
  Filled 2020-11-25: qty 30

## 2020-11-25 MED ORDER — FENTANYL CITRATE (PF) 100 MCG/2ML IJ SOLN
25.0000 ug | INTRAMUSCULAR | Status: DC | PRN
  Administered 2020-11-25 (×3): 50 ug via INTRAVENOUS

## 2020-11-25 MED ORDER — MIDAZOLAM HCL 2 MG/2ML IJ SOLN
INTRAMUSCULAR | Status: AC
  Filled 2020-11-25: qty 2

## 2020-11-25 MED ORDER — ONDANSETRON HCL 4 MG/2ML IJ SOLN
INTRAMUSCULAR | Status: DC | PRN
  Administered 2020-11-25: 4 mg via INTRAVENOUS

## 2020-11-25 MED ORDER — OXYCODONE HCL 5 MG PO TABS: 5.0000 mg | ORAL_TABLET | Freq: Four times a day (QID) | ORAL | 0 refills | Status: DC | PRN

## 2020-11-25 MED ORDER — FENTANYL CITRATE (PF) 250 MCG/5ML IJ SOLN
INTRAMUSCULAR | Status: DC | PRN
  Administered 2020-11-25 (×3): 50 ug via INTRAVENOUS
  Administered 2020-11-25: 100 ug via INTRAVENOUS

## 2020-11-25 MED ORDER — CEFAZOLIN SODIUM-DEXTROSE 2-4 GM/100ML-% IV SOLN
2.0000 g | INTRAVENOUS | Status: AC
  Administered 2020-11-25: 2 g via INTRAVENOUS
  Filled 2020-11-25: qty 100

## 2020-11-25 MED ORDER — SCOPOLAMINE 1 MG/3DAYS TD PT72
MEDICATED_PATCH | TRANSDERMAL | Status: DC | PRN
  Administered 2020-11-25: 1 via TRANSDERMAL

## 2020-11-25 MED ORDER — DEXAMETHASONE SODIUM PHOSPHATE 10 MG/ML IJ SOLN
INTRAMUSCULAR | Status: DC | PRN
  Administered 2020-11-25: 10 mg via INTRAVENOUS

## 2020-11-25 MED ORDER — ACETAMINOPHEN 500 MG PO TABS
1000.0000 mg | ORAL_TABLET | ORAL | Status: AC
  Administered 2020-11-25: 1000 mg via ORAL
  Filled 2020-11-25: qty 2

## 2020-11-25 MED ORDER — SUGAMMADEX SODIUM 200 MG/2ML IV SOLN
INTRAVENOUS | Status: DC | PRN
  Administered 2020-11-25: 250 mg via INTRAVENOUS

## 2020-11-25 MED ORDER — FENTANYL CITRATE (PF) 100 MCG/2ML IJ SOLN
INTRAMUSCULAR | Status: AC
  Filled 2020-11-25: qty 2

## 2020-11-25 MED ORDER — LACTATED RINGERS IV SOLN: INTRAVENOUS | Status: DC

## 2020-11-25 MED ORDER — BUPIVACAINE-EPINEPHRINE 0.25% -1:200000 IJ SOLN
INTRAMUSCULAR | Status: DC | PRN
  Administered 2020-11-25: 11 mL

## 2020-11-25 MED ORDER — DEXAMETHASONE SODIUM PHOSPHATE 10 MG/ML IJ SOLN
INTRAMUSCULAR | Status: AC
  Filled 2020-11-25: qty 1

## 2020-11-25 MED ORDER — PROPOFOL 10 MG/ML IV BOLUS
INTRAVENOUS | Status: AC
  Filled 2020-11-25: qty 20

## 2020-11-25 MED ORDER — PROPOFOL 10 MG/ML IV BOLUS
INTRAVENOUS | Status: DC | PRN
  Administered 2020-11-25: 130 mg via INTRAVENOUS

## 2020-11-25 MED ORDER — LIDOCAINE 2% (20 MG/ML) 5 ML SYRINGE
INTRAMUSCULAR | Status: DC | PRN
  Administered 2020-11-25: 60 mg via INTRAVENOUS

## 2020-11-25 MED ORDER — ROCURONIUM BROMIDE 10 MG/ML (PF) SYRINGE
PREFILLED_SYRINGE | INTRAVENOUS | Status: DC | PRN
  Administered 2020-11-25: 70 mg via INTRAVENOUS

## 2020-11-25 SURGICAL SUPPLY — 45 items
APPLIER CLIP ROT 10 11.4 M/L (STAPLE) ×3
BAG COUNTER SPONGE SURGICOUNT (BAG) ×2 IMPLANT
BAG SURGICOUNT SPONGE COUNTING (BAG) ×1
BENZOIN TINCTURE PRP APPL 2/3 (GAUZE/BANDAGES/DRESSINGS) ×3 IMPLANT
CANISTER SUCT 3000ML PPV (MISCELLANEOUS) ×3 IMPLANT
CHLORAPREP W/TINT 26 (MISCELLANEOUS) ×3 IMPLANT
CLIP APPLIE ROT 10 11.4 M/L (STAPLE) ×1 IMPLANT
CLOSURE WOUND 1/2 X4 (GAUZE/BANDAGES/DRESSINGS) ×1
COVER MAYO STAND STRL (DRAPES) ×6 IMPLANT
COVER SURGICAL LIGHT HANDLE (MISCELLANEOUS) ×3 IMPLANT
DRAPE C-ARM 42X120 X-RAY (DRAPES) ×3 IMPLANT
DRSG TEGADERM 2-3/8X2-3/4 SM (GAUZE/BANDAGES/DRESSINGS) ×3 IMPLANT
DRSG TEGADERM 4X4.75 (GAUZE/BANDAGES/DRESSINGS) ×3 IMPLANT
ELECT REM PT RETURN 9FT ADLT (ELECTROSURGICAL) ×3
ELECTRODE REM PT RTRN 9FT ADLT (ELECTROSURGICAL) ×1 IMPLANT
GAUZE SPONGE 2X2 8PLY STRL LF (GAUZE/BANDAGES/DRESSINGS) ×1 IMPLANT
GLOVE SURG ENC MOIS LTX SZ7 (GLOVE) ×3 IMPLANT
GLOVE SURG UNDER POLY LF SZ7.5 (GLOVE) ×3 IMPLANT
GOWN STRL REUS W/ TWL LRG LVL3 (GOWN DISPOSABLE) ×3 IMPLANT
GOWN STRL REUS W/TWL LRG LVL3 (GOWN DISPOSABLE) ×9
KIT BASIN OR (CUSTOM PROCEDURE TRAY) ×3 IMPLANT
KIT TURNOVER KIT B (KITS) ×3 IMPLANT
NS IRRIG 1000ML POUR BTL (IV SOLUTION) ×3 IMPLANT
PAD ARMBOARD 7.5X6 YLW CONV (MISCELLANEOUS) ×3 IMPLANT
POUCH RETRIEVAL ECOSAC 10 (ENDOMECHANICALS) ×1 IMPLANT
POUCH RETRIEVAL ECOSAC 10MM (ENDOMECHANICALS) ×3
SCISSORS LAP 5X35 DISP (ENDOMECHANICALS) ×3 IMPLANT
SET CHOLANGIOGRAPH 5 50 .035 (SET/KITS/TRAYS/PACK) ×3 IMPLANT
SET IRRIG TUBING LAPAROSCOPIC (IRRIGATION / IRRIGATOR) ×3 IMPLANT
SET TUBE SMOKE EVAC HIGH FLOW (TUBING) ×3 IMPLANT
SLEEVE ENDOPATH XCEL 5M (ENDOMECHANICALS) ×3 IMPLANT
SPECIMEN JAR SMALL (MISCELLANEOUS) ×3 IMPLANT
SPONGE GAUZE 2X2 8PLY STER LF (GAUZE/BANDAGES/DRESSINGS) ×1
SPONGE GAUZE 2X2 8PLY STRL LF (GAUZE/BANDAGES/DRESSINGS) ×2 IMPLANT
SPONGE GAUZE 2X2 STER 10/PKG (GAUZE/BANDAGES/DRESSINGS) ×2
STRIP CLOSURE SKIN 1/2X4 (GAUZE/BANDAGES/DRESSINGS) ×2 IMPLANT
SUT MNCRL AB 4-0 PS2 18 (SUTURE) ×3 IMPLANT
TOWEL GREEN STERILE (TOWEL DISPOSABLE) ×3 IMPLANT
TOWEL GREEN STERILE FF (TOWEL DISPOSABLE) ×3 IMPLANT
TRAY LAPAROSCOPIC MC (CUSTOM PROCEDURE TRAY) ×3 IMPLANT
TROCAR XCEL BLUNT TIP 100MML (ENDOMECHANICALS) ×3 IMPLANT
TROCAR XCEL NON-BLD 11X100MML (ENDOMECHANICALS) ×3 IMPLANT
TROCAR XCEL NON-BLD 5MMX100MML (ENDOMECHANICALS) ×3 IMPLANT
WARMER LAPAROSCOPE (MISCELLANEOUS) ×3 IMPLANT
WATER STERILE IRR 1000ML POUR (IV SOLUTION) ×3 IMPLANT

## 2020-11-25 NOTE — Anesthesia Procedure Notes (Signed)
Procedure Name: Intubation Date/Time: 11/25/2020 9:39 AM Performed by: Colin Benton, CRNA Pre-anesthesia Checklist: Patient identified, Emergency Drugs available, Suction available and Patient being monitored Patient Re-evaluated:Patient Re-evaluated prior to induction Oxygen Delivery Method: Circle system utilized Preoxygenation: Pre-oxygenation with 100% oxygen Induction Type: IV induction Ventilation: Mask ventilation without difficulty Laryngoscope Size: Miller and 2 Grade View: Grade I Tube type: Oral Tube size: 7.0 mm Number of attempts: 1 Airway Equipment and Method: Stylet Placement Confirmation: ETT inserted through vocal cords under direct vision, positive ETCO2 and breath sounds checked- equal and bilateral Secured at: 22 cm Tube secured with: Tape Dental Injury: Teeth and Oropharynx as per pre-operative assessment

## 2020-11-25 NOTE — Discharge Instructions (Signed)
[facility] ______[facility] LAPAROSCOPIC SURGERY: POST OP INSTRUCTIONS Always review your discharge instruction sheet given to you by the facility where your surgery was performed. IF YOU HAVE DISABILITY OR FAMILY LEAVE FORMS, YOU MUST BRING THEM TO THE OFFICE FOR PROCESSING.   DO NOT GIVE THEM TO YOUR DOCTOR.  A prescription for pain medication may be given to you upon discharge.  Take your pain medication as prescribed, if needed.  If narcotic pain medicine is not needed, then you may take acetaminophen (Tylenol) or ibuprofen (Advil) as needed. Take your usually prescribed medications unless otherwise directed. If you need a refill on your pain medication, please contact your pharmacy.  They will contact our office to request authorization. Prescriptions will not be filled after 5pm or on week-ends. You should follow a light diet the first few days after arrival home, such as soup and crackers, etc.  Be sure to include lots of fluids daily. Most patients will experience some swelling and bruising in the area of the incisions.  Ice packs will help.  Swelling and bruising can take several days to resolve.  It is common to experience some constipation if taking pain medication after surgery.  Increasing fluid intake and taking a stool softener (such as Colace) will usually help or prevent this problem from occurring.  A mild laxative (Milk of Magnesia or Miralax) should be taken according to package instructions if there are no bowel movements after 48 hours. Unless discharge instructions indicate otherwise, you may remove your bandages 24-48 hours after surgery, and you may shower at that time.  You may have steri-strips (small skin tapes) in place directly over the incision.  These strips should be left on the skin for 7-10 days.  If your surgeon used skin glue on the incision, you may shower in 24 hours.  The glue will flake off over the next 2-3 weeks.  Any sutures or staples will be  removed at the office during your follow-up visit. ACTIVITIES:  You may resume regular (light) daily activities beginning the next day--such as daily self-care, walking, climbing stairs--gradually increasing activities as tolerated.  You may have sexual intercourse when it is comfortable.  Refrain from any heavy lifting or straining until approved by your doctor. You may drive when you are no longer taking prescription pain medication, you can comfortably wear a seatbelt, and you can safely maneuver your car and apply brakes. RETURN TO WORK:  __________________________________________________________ You should see your doctor in the office for a follow-up appointment approximately 2-3 weeks after your surgery.  Make sure that you call for this appointment within a day or two after you arrive home to insure a convenient appointment time. OTHER INSTRUCTIONS: __________________________________________________________________________________________________________________________ __________________________________________________________________________________________________________________________ WHEN TO CALL YOUR DOCTOR: Fever over 101.0 Inability to urinate Continued bleeding from incision. Increased pain, redness, or drainage from the incision. Increasing abdominal pain  The clinic staff is available to answer your questions during regular business hours.  Please don't hesitate to call and ask to speak to one of the nurses for clinical concerns.  If you have a medical emergency, go to the nearest emergency room or call 911.  A surgeon from [facility] is always on call at the hospital. [address], [address], [place], [place]  [address] ? [address] [address], ,    [address] [phone] ? [phone] ? FAX [phone] Web site: [email or web address]  

## 2020-11-25 NOTE — Interval H&P Note (Signed)
History and Physical Interval Note:  11/25/2020 8:50 AM  Maria Schaefer  has presented today for surgery, with the diagnosis of CHRONIC CHOLECYSTITIS.  The various methods of treatment have been discussed with the patient and family. After consideration of risks, benefits and other options for treatment, the patient has consented to  Procedure(s): LAPAROSCOPIC CHOLECYSTECTOMY WITH INTRAOPERATIVE CHOLANGIOGRAM (N/A) as a surgical intervention.  The patient's history has been reviewed, patient examined, no change in status, stable for surgery.  I have reviewed the patient's chart and labs.  Questions were answered to the patient's satisfaction.     Maia Petties

## 2020-11-25 NOTE — Op Note (Signed)
Laparoscopic Cholecystectomy with IOC Procedure Note  Indications: This patient presents with symptomatic gallbladder disease and will undergo laparoscopic cholecystectomy.  Pre-operative Diagnosis: Calculus of gallbladder with other cholecystitis, without mention of obstruction  Post-operative Diagnosis: Same  Surgeon: Maia Petties   Assistants: Dr. Coralie Keens  Anesthesia: General endotracheal anesthesia  ASA Class: 2  Procedure Details  The patient was seen again in the Holding Room. The risks, benefits, complications, treatment options, and expected outcomes were discussed with the patient. The possibilities of reaction to medication, pulmonary aspiration, perforation of viscus, bleeding, recurrent infection, finding a normal gallbladder, the need for additional procedures, failure to diagnose a condition, the possible need to convert to an open procedure, and creating a complication requiring transfusion or operation were discussed with the patient. The likelihood of improving the patient's symptoms with return to their baseline status is good.  The patient and/or family concurred with the proposed plan, giving informed consent. The site of surgery properly noted. The patient was taken to Operating Room, identified as Maria Schaefer and the procedure verified as Laparoscopic Cholecystectomy with Intraoperative Cholangiogram. A Time Out was held and the above information confirmed.  Prior to the induction of general anesthesia, antibiotic prophylaxis was administered. General endotracheal anesthesia was then administered and tolerated well. After the induction, the abdomen was prepped with Chloraprep and draped in the sterile fashion. The patient was positioned in the supine position.  Local anesthetic agent was injected into the skin near the umbilicus and an incision made. We dissected down to the abdominal fascia with blunt dissection.  The fascia was incised vertically and we  entered the peritoneal cavity bluntly.  A pursestring suture of 0-Vicryl was placed around the fascial opening.  The Hasson cannula was inserted and secured with the stay suture.  Pneumoperitoneum was then created with CO2 and tolerated well without any adverse changes in the patient's vital signs. An 11-mm port was placed in the subxiphoid position.  Two 5-mm ports were placed in the right upper quadrant. All skin incisions were infiltrated with a local anesthetic agent before making the incision and placing the trocars.   We positioned the patient in reverse Trendelenburg, tilted slightly to the patient's left.  The gallbladder was identified, the fundus grasped and retracted cephalad. The gallbladder has some omental adhesions to the surface.  Adhesions were lysed bluntly and with the electrocautery where indicated, taking care not to injure any adjacent organs or viscus. The infundibulum was grasped and retracted laterally, exposing the peritoneum overlying the triangle of Calot. This was then divided and exposed in a blunt fashion. A critical view of the cystic duct and cystic artery was obtained.  The cystic duct was clearly identified and bluntly dissected circumferentially. The cystic duct was ligated with a clip distally.   An incision was made in the cystic duct and the [facility] cholangiogram catheter introduced. The catheter was secured using a clip. A cholangiogram was then obtained which showed good visualization of the distal and proximal biliary tree with no sign of filling defects or obstruction.  Contrast flowed easily into the duodenum. The catheter was then removed.   The cystic duct was then ligated with clips and divided. The cystic artery was identified, dissected free, ligated with clips and divided as well.   The gallbladder was dissected from the liver bed in retrograde fashion with the electrocautery. The gallbladder was removed and placed in an Ecosac. The liver bed was irrigated and  inspected. Hemostasis was achieved with the  electrocautery. Copious irrigation was utilized and was repeatedly aspirated until clear.  The gallbladder and Ecosac were then removed through the umbilical port site.  The pursestring suture was used to close the umbilical fascia.    We again inspected the right upper quadrant for hemostasis.  Pneumoperitoneum was released as we removed the trocars.  4-0 Monocryl was used to close the skin.   Benzoin, steri-strips, and clean dressings were applied. The patient was then extubated and brought to the recovery room in stable condition. Instrument, sponge, and needle counts were correct at closure and at the conclusion of the case.   Findings: Cholecystitis with Cholelithiasis  Estimated Blood Loss: Minimal         Drains: none         Specimens: Gallbladder           Complications: None; patient tolerated the procedure well.         Disposition: PACU - hemodynamically stable.         Condition: stable  Maria Burn. Georgette Dover, MD, [facility]  General Surgery   11/25/2020 10:32 AM

## 2020-11-25 NOTE — Transfer of Care (Signed)
Immediate Anesthesia Transfer of Care Note  Patient: Clearance Coots  Procedure(s) Performed: LAPAROSCOPIC CHOLECYSTECTOMY WITH INTRAOPERATIVE CHOLANGIOGRAM (Abdomen)  Patient Location: PACU  Anesthesia Type:General  Level of Consciousness: drowsy  Airway & Oxygen Therapy: Patient Spontanous Breathing  Post-op Assessment: Report given to RN, Post -op Vital signs reviewed and stable and Patient moving all extremities X 4  Post vital signs: Reviewed and stable  Last Vitals:  Vitals Value Taken Time  BP 161/96 11/25/20 1040  Temp    Pulse 81 11/25/20 1041  Resp 17 11/25/20 1041  SpO2 93 % 11/25/20 1041  Vitals shown include unvalidated device data.  Last Pain:  Vitals:   11/25/20 0263  TempSrc: Oral  PainSc:       Patients Stated Pain Goal: 0 (78/58/85 0277)  Complications: No notable events documented.

## 2020-11-26 ENCOUNTER — Encounter (HOSPITAL_COMMUNITY): Payer: Self-pay | Admitting: Surgery

## 2020-11-26 LAB — SURGICAL PATHOLOGY

## 2020-11-27 NOTE — Anesthesia Postprocedure Evaluation (Signed)
Anesthesia Post Note  Patient: Maria Schaefer  Procedure(s) Performed: LAPAROSCOPIC CHOLECYSTECTOMY WITH INTRAOPERATIVE CHOLANGIOGRAM (Abdomen)     Patient location during evaluation: PACU Anesthesia Type: General Level of consciousness: awake and alert Pain management: pain level controlled Vital Signs Assessment: post-procedure vital signs reviewed and stable Respiratory status: spontaneous breathing, nonlabored ventilation, respiratory function stable and patient connected to nasal cannula oxygen Cardiovascular status: blood pressure returned to baseline and stable Postop Assessment: no apparent nausea or vomiting Anesthetic complications: no   No notable events documented.  Last Vitals:  Vitals:   11/25/20 1110 11/25/20 1125  BP: (!) 144/92 (!) 132/57  Pulse: 80 83  Resp: 15 16  Temp:  (!) 36.1 C  SpO2: 96% 92%    Last Pain:  Vitals:   11/25/20 1125  TempSrc:   PainSc: 4                  Mamoudou Mulvehill

## 2021-10-08 ENCOUNTER — Other Ambulatory Visit: Payer: Self-pay | Admitting: Family Medicine

## 2021-10-08 DIAGNOSIS — Z1231 Encounter for screening mammogram for malignant neoplasm of breast: Secondary | ICD-10-CM

## 2021-11-10 ENCOUNTER — Ambulatory Visit
Admission: RE | Admit: 2021-11-10 | Discharge: 2021-11-10 | Disposition: A | Discharge status: Home / Self Care | Payer: 59 | Source: Ambulatory Visit | Attending: Family Medicine | Admitting: Family Medicine

## 2021-11-10 DIAGNOSIS — Z1231 Encounter for screening mammogram for malignant neoplasm of breast: Secondary | ICD-10-CM

## 2022-03-25 ENCOUNTER — Encounter: Payer: Self-pay | Admitting: Nurse Practitioner

## 2022-03-25 ENCOUNTER — Encounter: Payer: 59 | Admitting: Nurse Practitioner

## 2022-03-25 NOTE — Progress Notes (Deleted)
Office: [phone]  /  Fax: [phone]   Initial Visit  Maria Schaefer was seen in clinic today to evaluate for obesity. She is interested in losing weight to improve overall health and reduce the risk of weight related complications. She presents today to review program treatment options, initial physical assessment, and evaluation.     She was referred by: {emreferby:28303}  When asked what else they would like to accomplish? She states: {EMHopetoaccomplish:28304}  Weight history:  When asked how has your weight affected you? She states: {EMWeightAffected:28305}  Some associated conditions: {EMSomeConditions:28306}  Contributing factors: {EMcontributingfactors:28307}  Weight promoting medications identified: {EMWeightpromotingrx:28308}  Current nutrition plan: {EMNutritionplan:28309::"None"}  Current level of physical activity: {EMcurrentPA:28310::"None"}  Current or previous pharmacotherapy: {EM previousRx:28311}  Response to medication: {EMResponsetomedication:28312}   Past medical history includes:   Past Medical History:  Diagnosis Date   AMA (advanced maternal age) multigravida 35+    Anxiety    Depression    tried to harm herself once   Dysrhythmia    PVSs   GERD (gastroesophageal reflux disease)    Hx of varicella    Insomnia    Intentional drug overdose ([place]) 06/12/2011   OD on ambien   Intermittent low back pain      Objective:   BP 122/83   Pulse 77   Temp 98.8 F (37.1 C)   Ht '5\' 6"'$  (1.676 m)   Wt 255 lb (115.7 kg)   LMP 03/10/2022   SpO2 97%   BMI 41.16 kg/m  She was weighed on the bioimpedance scale: Body mass index is 41.16 kg/m.  Peak Weight:*** , Body Fat%:***, Visceral Fat Rating:***, Weight trend over the last 12 months: {emweighttrend:28333}  General:  Alert, oriented and cooperative. Patient is in no acute distress.  Respiratory: Normal respiratory effort, no problems with respiration noted   Gait: able to ambulate  independently  Mental Status: Normal mood and affect. Normal behavior. Normal judgment and thought content.   DIAGNOSTIC DATA REVIEWED:  BMET    Component Value Date/Time   NA 135 11/07/2020 2320   K 3.7 11/07/2020 2320   CL 105 11/07/2020 2320   CO2 23 11/07/2020 2320   GLUCOSE 115 (H) 11/07/2020 2320   BUN 9 11/07/2020 2320   CREATININE 0.79 11/07/2020 2320   CALCIUM 8.7 (L) 11/07/2020 2320   GFRNONAA >60 11/07/2020 2320   GFRAA >90 03/27/2013 1830   No results found for: "HGBA1C" No results found for: "INSULIN" CBC    Component Value Date/Time   WBC 6.7 11/25/2020 0755   RBC 4.14 11/25/2020 0755   HGB 12.2 11/25/2020 0755   HCT 36.8 11/25/2020 0755   PLT 398 11/25/2020 0755   MCV 88.9 11/25/2020 0755   MCH 29.5 11/25/2020 0755   MCHC 33.2 11/25/2020 0755   RDW 12.9 11/25/2020 0755   Iron/TIBC/Ferritin/ %Sat No results found for: "IRON", "TIBC", "FERRITIN", "IRONPCTSAT" Lipid Panel  No results found for: "CHOL", "TRIG", "HDL", "CHOLHDL", "VLDL", "LDLCALC", "LDLDIRECT" Hepatic Function Panel     Component Value Date/Time   PROT 7.2 11/07/2020 2320   ALBUMIN 3.6 11/07/2020 2320   AST 19 11/07/2020 2320   ALT 17 11/07/2020 2320   ALKPHOS 79 11/07/2020 2320   BILITOT 0.4 11/07/2020 2320   No results found for: "TSH"   Assessment and Plan:   There are no diagnoses linked to this encounter.      Obesity Treatment / Action Plan:  {EMobesityactionplanscribe:28314::"Patient will work on garnering support from family and friends to begin weight  loss journey.","Will work on eliminating or reducing the presence of highly palatable, calorie dense foods in the home.","Will complete provided nutritional and psychosocial assessment questionnaire before the next appointment.","Will be scheduled for indirect calorimetry to determine resting energy expenditure in a fasting state.  This will allow [place] to create a reduced calorie, high-protein meal plan to promote loss of fat  mass while preserving muscle mass."}  Obesity Education Performed Today:  She was weighed on the bioimpedance scale and results were discussed and documented in the synopsis.  We discussed obesity as a disease and the importance of a more detailed evaluation of all the factors contributing to the disease.  We discussed the importance of long term lifestyle changes which include nutrition, exercise and behavioral modifications as well as the importance of customizing this to her specific health and social needs.  We discussed the benefits of reaching a healthier weight to alleviate the symptoms of existing conditions and reduce the risks of the biomechanical, metabolic and psychological effects of obesity.  Maria Schaefer appears to be in the action stage of change and states they are ready to start intensive lifestyle modifications and behavioral modifications.  *** minutes was spent today on this visit including the above counseling, pre-visit chart review, and post-visit documentation.  Reviewed by clinician on day of visit: allergies, medications, problem list, medical history, surgical history, family history, social history, and previous encounter notes pertinent to obesity diagnosis.    Maria Rud Tickerhoff FNP-C

## 2022-10-07 ENCOUNTER — Other Ambulatory Visit: Payer: Self-pay | Admitting: Family Medicine

## 2022-10-07 DIAGNOSIS — Z1231 Encounter for screening mammogram for malignant neoplasm of breast: Secondary | ICD-10-CM

## 2022-11-18 ENCOUNTER — Ambulatory Visit
Admission: RE | Admit: 2022-11-18 | Discharge: 2022-11-18 | Disposition: A | Discharge status: Home / Self Care | Payer: 59 | Source: Ambulatory Visit | Attending: Family Medicine | Admitting: Family Medicine

## 2022-11-18 DIAGNOSIS — Z1231 Encounter for screening mammogram for malignant neoplasm of breast: Secondary | ICD-10-CM

## 2023-02-22 ENCOUNTER — Ambulatory Visit: Payer: 59

## 2023-02-22 ENCOUNTER — Ambulatory Visit
Admission: RE | Admit: 2023-02-22 | Discharge: 2023-02-22 | Disposition: A | Discharge status: Home / Self Care | Payer: 59 | Source: Ambulatory Visit | Attending: Emergency Medicine | Admitting: Emergency Medicine

## 2023-02-22 VITALS — BP 109/75 | HR 81 | Temp 98.0°F | Resp 17

## 2023-02-22 DIAGNOSIS — R197 Diarrhea, unspecified: Secondary | ICD-10-CM | POA: Diagnosis not present

## 2023-02-22 DIAGNOSIS — R112 Nausea with vomiting, unspecified: Secondary | ICD-10-CM

## 2023-02-22 DIAGNOSIS — Z2089 Contact with and (suspected) exposure to other communicable diseases: Secondary | ICD-10-CM | POA: Insufficient documentation

## 2023-02-22 DIAGNOSIS — Z9049 Acquired absence of other specified parts of digestive tract: Secondary | ICD-10-CM | POA: Insufficient documentation

## 2023-02-22 DIAGNOSIS — Z20818 Contact with and (suspected) exposure to other bacterial communicable diseases: Secondary | ICD-10-CM

## 2023-02-22 LAB — POC COVID19/FLU A&B COMBO
Covid Antigen, POC: NEGATIVE
Influenza A Antigen, POC: NEGATIVE
Influenza B Antigen, POC: NEGATIVE

## 2023-02-22 LAB — POCT RAPID STREP A (OFFICE): Rapid Strep A Screen: NEGATIVE

## 2023-02-22 MED ORDER — FLUTICASONE PROPIONATE 50 MCG/ACT NA SUSP: 1.0000 | Freq: Every day | NASAL | 2 refills | Status: AC

## 2023-02-22 MED ORDER — MONTELUKAST SODIUM 10 MG PO TABS: 10.0000 mg | ORAL_TABLET | Freq: Every day | ORAL | 2 refills | Status: AC

## 2023-02-22 MED ORDER — CETIRIZINE HCL 10 MG PO TABS: 10.0000 mg | ORAL_TABLET | Freq: Every day | ORAL | 2 refills | Status: AC

## 2023-02-22 NOTE — ED Triage Notes (Signed)
Initially last Monday had n/v/d then got better. Two days ago started again. Taking Zofran.  Had some lower GI pain yesterday but better today.

## 2023-02-22 NOTE — ED Provider Notes (Signed)
[facility]    CSN: [id number] Arrival date & time: 02/22/23  2130    HISTORY   Chief Complaint  Patient presents with   Nausea    Vomiting - Entered by patient   Emesis   Diarrhea   HPI Maria Schaefer is a pleasant, 48 y.o. female who presents to urgent care today. Patient reports nausea vomiting and diarrhea 8 days ago which improved after taking Zofran.  Patient states that she felt better and then 2 days ago began to have similar symptoms without diarrhea.  Patient states she tried the Zofran again but it did not help as much.  Patient states yesterday she had some lower GI pain but today that is better.  Patient states she had her gallbladder removed in 2022, since then has had no issues with constipation and actually has more trouble with diarrhea, has been prescribed cholestyramine for this which she states helps.  Patient states he has not had any diarrhea.  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  The history is provided by the patient.   Past Medical History:  Diagnosis Date   AMA (advanced maternal age) multigravida 35+    Anxiety    Depression    tried to harm herself once   Dysrhythmia    PVSs   GERD (gastroesophageal reflux disease)    Hx of varicella    Insomnia    Intentional drug overdose (HCC) 06/12/2011   OD on ambien   Intermittent low back pain    Patient Active Problem List   Diagnosis Date Noted   Adult BMI 30.0-30.9 kg/sq m 11/08/2020   Other and unspecified hyperlipidemia 11/08/2020   Lichen simplex 12/21/2018   Irritant dermatitis 12/20/2018   Osteoarthritis of right knee 12/01/2018   Osteoarthritis of  left knee 12/01/2018   Impingement syndrome of left shoulder region 02/09/2017   Acne vulgaris 02/19/2015   Non-intractable vomiting with nausea 09/26/2013   Right upper quadrant pain 09/26/2013   Chronic cholecystitis 05/29/2013   Major depressive disorder 03/28/2013   Overdose 03/28/2013   Syncope 10/07/2011   Allergic rhinitis 01/07/2011   Epidermoid cyst 01/07/2011   Insomnia 01/07/2011   Chronic low back pain 01/07/2011   Past Surgical History:  Procedure Laterality Date   CHOLECYSTECTOMY N/A 11/25/2020   Procedure: LAPAROSCOPIC CHOLECYSTECTOMY WITH INTRAOPERATIVE CHOLANGIOGRAM;  Surgeon: Manus Rudd, MD;  Location: [facility] OR;  Service: General;  Laterality: N/A;   NO PAST SURGERIES     OB History     Gravida  2   Para  1   Term  1   Preterm      AB  1   Living  1      SAB  1   IAB      Ectopic      Multiple      Live Births  1          Home Medications    Prior to Admission medications   Medication Sig Start Date End Date Taking? Authorizing Provider  ondansetron (ZOFRAN-ODT) 4 MG disintegrating tablet Take 4 mg by mouth every 6 (six) hours as needed. 02/18/23  Yes [provider]  buPROPion (WELLBUTRIN XL) 150 MG 24 hr tablet Take 150 mg by mouth in the morning.    [provider]  cyclobenzaprine (FLEXERIL) 10 MG tablet Take 10 mg by mouth daily as needed for muscle spasms. 07/28/20   [provider]  fluticasone (FLONASE) 50 MCG/ACT nasal spray Place 1 spray into both nostrils 2 (two) times daily as needed for allergies. 09/23/20   [provider]  JUNEL FE 1/20 1-20 MG-MCG tablet Take 1 tablet by mouth in the morning. 10/16/20   [provider]  meloxicam (MOBIC) 15 MG tablet Take 15 mg by mouth daily as needed for pain.    [provider]  montelukast (SINGULAIR) 10 MG tablet Take 10 mg by mouth daily as needed (allergies.). 06/10/20   [provider]  oxyCODONE (OXY IR/ROXICODONE) 5 MG  immediate release tablet Take 1 tablet (5 mg total) by mouth every 6 (six) hours as needed for severe pain. 11/25/20   Manus Rudd, MD  traZODone (DESYREL) 50 MG tablet Take 50-100 mg by mouth at bedtime as needed for sleep. 09/17/20   [provider]  zolpidem (AMBIEN CR) 12.5 MG CR tablet Take 12.5 mg by mouth at bedtime.    [provider]    Family History Family History  Problem Relation Age of Onset   Hypertension Sister    Diabetes Sister    Breast cancer Paternal Aunt    BRCA 1/2 Neg Hx    Social History Social History   Tobacco Use   Smoking status: Never   Smokeless tobacco: Never  Vaping Use   Vaping status: Never Used  Substance Use Topics   Alcohol use: Yes    Comment: occ   Drug use: No   Allergies   Ciprofloxacin  Review of Systems Review of Systems Pertinent findings revealed after performing a 14 point review of systems has been noted in the history of present illness.  Physical Exam Vital Signs BP 109/75 (BP Location: Right Arm)   Pulse 81   Temp 98 F (36.7 C) (Oral)   Resp 17   LMP 02/07/2023   SpO2 96%   No data found.  Physical Exam Vitals and nursing note reviewed.  Constitutional:      General: She is not in acute distress.    Appearance: Normal appearance. She is not ill-appearing.  HENT:     Head: Normocephalic and atraumatic.     Salivary Glands: Right salivary gland is not diffusely enlarged or tender. Left salivary gland is not diffusely enlarged or tender.     Right Ear: Ear canal and external ear normal. No drainage. A middle ear effusion is present. There is no impacted cerumen. Tympanic membrane is bulging. Tympanic membrane is not injected or erythematous.     Left Ear: Ear canal and external ear normal. No drainage. A middle ear effusion is present. There is no impacted cerumen. Tympanic membrane is bulging. Tympanic membrane is not injected or erythematous.     Ears:     Comments: Bilateral EACs normal,  both TMs bulging with clear fluid    Nose: Rhinorrhea present. No nasal deformity, septal deviation, signs of injury, nasal tenderness, mucosal edema or congestion. Rhinorrhea is clear.     Right Nostril: Occlusion present. No foreign body, epistaxis or septal hematoma.     Left Nostril: Occlusion present. No foreign body, epistaxis or septal hematoma.     Right Turbinates: Enlarged, swollen and pale.     Left Turbinates:  Enlarged, swollen and pale.     Right Sinus: No maxillary sinus tenderness or frontal sinus tenderness.     Left Sinus: No maxillary sinus tenderness or frontal sinus tenderness.     Mouth/Throat:     Lips: Pink. No lesions.     Mouth: Mucous membranes are moist. No oral lesions.     Pharynx: Oropharynx is clear. Uvula midline. No posterior oropharyngeal erythema or uvula swelling.     Tonsils: No tonsillar exudate. 0 on the right. 0 on the left.     Comments: Postnasal drip Eyes:     General: Lids are normal.        Right eye: No discharge.        Left eye: No discharge.     Extraocular Movements: Extraocular movements intact.     Conjunctiva/sclera: Conjunctivae normal.     Right eye: Right conjunctiva is not injected.     Left eye: Left conjunctiva is not injected.  Neck:     Trachea: Trachea and phonation normal.  Cardiovascular:     Rate and Rhythm: Normal rate and regular rhythm.     Pulses: Normal pulses.     Heart sounds: Normal heart sounds. No murmur heard.    No friction rub. No gallop.  Pulmonary:     Effort: Pulmonary effort is normal. No accessory muscle usage, prolonged expiration or respiratory distress.     Breath sounds: Normal breath sounds. No stridor, decreased air movement or transmitted upper airway sounds. No decreased breath sounds, wheezing, rhonchi or rales.  Chest:     Chest wall: No tenderness.  Abdominal:     General: Abdomen is protuberant. Bowel sounds are normal.     Palpations: Abdomen is soft.     Tenderness: There is  generalized abdominal tenderness.  Musculoskeletal:        General: Normal range of motion.     Cervical back: Normal range of motion and neck supple. Normal range of motion.  Lymphadenopathy:     Cervical: No cervical adenopathy.  Skin:    General: Skin is warm and dry.     Findings: No erythema or rash.  Neurological:     General: No focal deficit present.     Mental Status: She is alert and oriented to person, place, and time.  Psychiatric:        Mood and Affect: Mood normal.        Behavior: Behavior normal.     Visual Acuity Right Eye Distance:   Left Eye Distance:   Bilateral Distance:    Right Eye Near:   Left Eye Near:    Bilateral Near:     UC Couse / Diagnostics / Procedures:     Radiology No results found.  Procedures Procedures (including critical care time) EKG  Pending results:  Labs Reviewed  CULTURE, GROUP A STREP [facility])  POCT RAPID STREP A (OFFICE)  POC COVID19/FLU A&B COMBO    Medications Ordered in UC: Medications - No data to display  UC Diagnoses / Final Clinical Impressions(s)   I have reviewed the triage vital signs and the nursing notes.  Pertinent labs & imaging results that were available during my care of the patient were reviewed by me and considered in my medical decision making (see chart for details).    Final diagnoses:  Exposure to Streptococcal pharyngitis  Nausea vomiting and diarrhea   Patient advised that abdominal x-ray is not concerning for bowel obstruction but a moderate amount of stool  was appreciated in large intestines.  Recommend continued use of Zofran and to monitor for signs of constipation.  Based on physical exam findings, I have provided a courtesy refill of patient's montelukast and Flonase, have also recommended cetirizine for relief of allergy symptoms.  Patient advised of negative influenza, COVID-19 and strep testing today.  Please see discharge instructions below for details of plan of care as  provided to patient. ED Prescriptions     Medication Sig Dispense Auth. Provider   cetirizine (ZYRTEC ALLERGY) 10 MG tablet Take 1 tablet (10 mg total) by mouth at bedtime. 30 tablet Theadora Rama Scales, PA-C   montelukast (SINGULAIR) 10 MG tablet Take 1 tablet (10 mg total) by mouth at bedtime. 30 tablet Theadora Rama Scales, PA-C   fluticasone (FLONASE) 50 MCG/ACT nasal spray Place 1 spray into both nostrils daily. Begin by using 2 sprays in each nare daily for 3 to 5 days, then decrease to 1 spray in each nare daily. 15.8 mL Theadora Rama Scales, PA-C      PDMP not reviewed this encounter.  Pending results:  Labs Reviewed  CULTURE, GROUP A STREP [facility])  POCT RAPID STREP A (OFFICE)  POC COVID19/FLU A&B COMBO      Discharge Instructions      Your strep test today is negative.  Streptococcal throat culture will be performed per our protocol.  The result of your throat culture will be posted to your MyChart once it is complete, this typically takes 3 to 5 days.  If your streptococcal throat culture is positive, you will be contacted by phone and antibiotics will prescribed for you.   Your rapid influenza antigen test today was negative.  No further influenza testing is indicated.     Your COVID-19 test is negative.  Please consider retesting in the next 2 to 3 days, particularly if you are not feeling any better.  You are welcome to return here to urgent care to have it done or you can take a home COVID-19 test.     If both your COVID-19 tests are negative, then you can safely assume that your illness is due to one of the many less serious illnesses circulating in our community right now.     Conservative care is recommended with rest, drinking plenty of clear fluids, eating only when hungry, taking supportive medications for your symptoms and avoiding being around other people.  Please remain at home until you are fever free for 24 hours without the use of antifever medications  such as Tylenol and ibuprofen.   Your abdominal x-ray was not concerning for any signs of obstruction or air-fluid levels.  Surgical clips from your gallbladder removal are visualized and in the correct position.  You do have a moderate amount of retained stool throughout your large intestines but not within in your colon.  I do not believe that your lower GI tract is causing your symptoms of nausea and vomiting at this time.  Please continue Zofran and cholestyramine as needed.  As a courtesy, I have refilled your Flonase and Singulair for you.  Have also provided you with a prescription for an antihistamine which you may find also helps reduce your bouts of diarrhea.  Thank you for visiting [facility] today.  We appreciate the opportunity to participate in your care.       Disposition Upon Discharge:  Condition: stable for discharge home  Patient presented with an acute illness with associated systemic symptoms and significant discomfort  requiring urgent management. In my opinion, this is a condition that a prudent lay person (someone who possesses an average knowledge of health and medicine) may potentially expect to result in complications if not addressed urgently such as respiratory distress, impairment of bodily function or dysfunction of bodily organs.   Routine symptom specific, illness specific and/or disease specific instructions were discussed with the patient and/or caregiver at length.   As such, the patient has been evaluated and assessed, work-up was performed and treatment was provided in alignment with urgent care protocols and evidence based medicine.  Patient/parent/caregiver has been advised that the patient may require follow up for further testing and treatment if the symptoms continue in spite of treatment, as clinically indicated and appropriate.  Patient/parent/caregiver has been advised to return to the [facility] or PCP if no better; to PCP or the Emergency  Department if new signs and symptoms develop, or if the current signs or symptoms continue to change or worsen for further workup, evaluation and treatment as clinically indicated and appropriate  The patient will follow up with their current PCP if and as advised. If the patient does not currently have a PCP we will assist them in obtaining one.   The patient may need specialty follow up if the symptoms continue, in spite of conservative treatment and management, for further workup, evaluation, consultation and treatment as clinically indicated and appropriate.  Patient/parent/caregiver verbalized understanding and agreement of plan as discussed.  All questions were addressed during visit.  Please see discharge instructions below for further details of plan.  This office note has been dictated using Teaching laboratory technician.  Unfortunately, this method of dictation can sometimes lead to typographical or grammatical errors.  I apologize for your inconvenience in advance if this occurs.  Please do not hesitate to reach out to me if clarification is needed.      Theadora Rama Scales, [place] 02/22/23 [phone]

## 2023-02-22 NOTE — Discharge Instructions (Addendum)
Your strep test today is negative.  Streptococcal throat culture will be performed per our protocol.  The result of your throat culture will be posted to your MyChart once it is complete, this typically takes 3 to 5 days.  If your streptococcal throat culture is positive, you will be contacted by phone and antibiotics will prescribed for you.   Your rapid influenza antigen test today was negative.  No further influenza testing is indicated.     Your COVID-19 test is negative.  Please consider retesting in the next 2 to 3 days, particularly if you are not feeling any better.  You are welcome to return here to urgent care to have it done or you can take a home COVID-19 test.     If both your COVID-19 tests are negative, then you can safely assume that your illness is due to one of the many less serious illnesses circulating in our community right now.     Conservative care is recommended with rest, drinking plenty of clear fluids, eating only when hungry, taking supportive medications for your symptoms and avoiding being around other people.  Please remain at home until you are fever free for 24 hours without the use of antifever medications such as Tylenol and ibuprofen.   Your abdominal x-ray was not concerning for any signs of obstruction or air-fluid levels.  Surgical clips from your gallbladder removal are visualized and in the correct position.  You do have a moderate amount of retained stool throughout your large intestines but not within in your colon.  I do not believe that your lower GI tract is causing your symptoms of nausea and vomiting at this time.  Please continue Zofran and cholestyramine as needed.  As a courtesy, I have refilled your Flonase and Singulair for you.  Have also provided you with a prescription for an antihistamine which you may find also helps reduce your bouts of diarrhea.  Thank you for visiting [facility] today.  We appreciate the opportunity to  participate in your care.

## 2023-02-25 LAB — CULTURE, GROUP A STREP (THRC)

## 2023-03-21 ENCOUNTER — Ambulatory Visit
Admission: RE | Admit: 2023-03-21 | Discharge: 2023-03-21 | Disposition: A | Discharge status: Home / Self Care | Source: Ambulatory Visit | Attending: Internal Medicine | Admitting: Internal Medicine

## 2023-03-21 VITALS — BP 137/72 | HR 98 | Temp 98.6°F | Resp 18

## 2023-03-21 DIAGNOSIS — K529 Noninfective gastroenteritis and colitis, unspecified: Secondary | ICD-10-CM

## 2023-03-21 MED ORDER — METOCLOPRAMIDE HCL 10 MG PO TABS: 10.0000 mg | ORAL_TABLET | Freq: Three times a day (TID) | ORAL | 0 refills | Status: AC | PRN

## 2023-03-21 MED ORDER — METOCLOPRAMIDE HCL 5 MG/ML IJ SOLN
10.0000 mg | Freq: Once | INTRAMUSCULAR | Status: AC
  Administered 2023-03-21: 10 mg via INTRAMUSCULAR

## 2023-03-21 NOTE — ED Provider Notes (Signed)
 [facility]    CSN: [id number] Arrival date & time: 03/21/23  1353      History   Chief Complaint Chief Complaint  Patient presents with   Abdominal Pain    Vomiting. - Entered by patient   Emesis   Nausea    HPI Maria Schaefer is a 48 y.o. female.   48 year old female who presents to urgent care with complaints of nausea, vomiting and abdominal pain and cramping.  This started this morning.  This started shortly after eating this morning.  She has tried to stay hydrated.  She tried to take Zofran but immediately vomited.  This was the orally disintegrating tablets.  She denies fevers or chills.  She has had constipation over the last 3 days but did have a small bowel movement yesterday.  She has been on Wegovy and has had some nausea and vomiting with this but never the extent of abdominal pain and cramping.  The abdominal pain and cramping is better than it was this morning.  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   Abdominal Pain Associated symptoms: constipation, nausea and vomiting   Associated symptoms: no chest pain, no chills, no cough, no dysuria, no fever, no hematuria, no shortness of breath and no sore throat   Emesis Associated symptoms: abdominal pain   Associated symptoms: no arthralgias, no chills, no cough, no fever and no sore throat     Past Medical History:  Diagnosis Date   AMA (advanced maternal age) multigravida 35+    Anxiety    Depression    tried to harm herself once   Dysrhythmia    PVSs   GERD (gastroesophageal reflux disease)    Hx of varicella    Insomnia    Intentional drug overdose (HCC) 06/12/2011   OD on ambien   Intermittent low back pain     Patient Active Problem List   Diagnosis Date Noted   Adult BMI 30.0-30.9 kg/sq m 11/08/2020   Other and unspecified hyperlipidemia 11/08/2020   Lichen simplex  12/21/2018   Irritant dermatitis 12/20/2018   Osteoarthritis of right knee 12/01/2018   Osteoarthritis of left knee 12/01/2018   Impingement syndrome of left shoulder region 02/09/2017   Acne vulgaris 02/19/2015   Non-intractable vomiting with nausea 09/26/2013   Right upper quadrant pain 09/26/2013   Chronic cholecystitis 05/29/2013   Major depressive disorder 03/28/2013   Overdose 03/28/2013   Syncope 10/07/2011   Allergic rhinitis 01/07/2011   Epidermoid cyst 01/07/2011   Insomnia 01/07/2011   Chronic low back pain 01/07/2011    Past Surgical History:  Procedure Laterality Date   CHOLECYSTECTOMY N/A 11/25/2020   Procedure: LAPAROSCOPIC CHOLECYSTECTOMY WITH INTRAOPERATIVE CHOLANGIOGRAM;  Surgeon: Manus Rudd, MD;  Location: [facility] OR;  Service: General;  Laterality: N/A;   NO PAST SURGERIES      OB History     Gravida  2   Para  1   Term  1   Preterm      AB  1   Living  1      SAB  1   IAB      Ectopic      Multiple      Live Births  1            Home Medications    Prior to Admission medications   Medication Sig  Start Date End Date Taking? Authorizing Provider  buPROPion (WELLBUTRIN XL) 150 MG 24 hr tablet Take 150 mg by mouth in the morning.   Yes [provider]  cetirizine (ZYRTEC ALLERGY) 10 MG tablet Take 1 tablet (10 mg total) by mouth at bedtime. 02/22/23 05/23/23 Yes Theadora Rama Scales, PA-C  fluticasone (FLONASE) 50 MCG/ACT nasal spray Place 1 spray into both nostrils daily. Begin by using 2 sprays in each nare daily for 3 to 5 days, then decrease to 1 spray in each nare daily. 02/22/23  Yes Theadora Rama Scales, PA-C  JUNEL FE 1/20 1-20 MG-MCG tablet Take 1 tablet by mouth in the morning. 10/16/20  Yes [provider]  meloxicam (MOBIC) 15 MG tablet Take 15 mg by mouth daily as needed for pain.   Yes [provider]  montelukast (SINGULAIR) 10 MG tablet Take 1 tablet (10 mg total) by mouth at bedtime. 02/22/23  05/23/23 Yes Theadora Rama Scales, PA-C  ondansetron (ZOFRAN-ODT) 4 MG disintegrating tablet Take 4 mg by mouth every 6 (six) hours as needed. 02/18/23  Yes [provider]  traZODone (DESYREL) 50 MG tablet Take 50-100 mg by mouth at bedtime as needed for sleep. 09/17/20  Yes [provider]  zolpidem (AMBIEN CR) 12.5 MG CR tablet Take 12.5 mg by mouth at bedtime.   Yes [provider]  cyclobenzaprine (FLEXERIL) 10 MG tablet Take 10 mg by mouth daily as needed for muscle spasms. 07/28/20   [provider]    Family History Family History  Problem Relation Age of Onset   Hypertension Sister    Diabetes Sister    Breast cancer Paternal Aunt    BRCA 1/2 Neg Hx     Social History Social History   Tobacco Use   Smoking status: Never   Smokeless tobacco: Never  Vaping Use   Vaping status: Never Used  Substance Use Topics   Alcohol use: Yes    Comment: occ   Drug use: No     Allergies   Ciprofloxacin   Review of Systems Review of Systems  Constitutional:  Negative for chills and fever.  HENT:  Negative for ear pain and sore throat.   Eyes:  Negative for pain and visual disturbance.  Respiratory:  Negative for cough and shortness of breath.   Cardiovascular:  Negative for chest pain and palpitations.  Gastrointestinal:  Positive for abdominal pain, constipation, nausea and vomiting.  Genitourinary:  Negative for dysuria and hematuria.  Musculoskeletal:  Negative for arthralgias and back pain.  Skin:  Negative for color change and rash.  Neurological:  Negative for seizures and syncope.  All other systems reviewed and are negative.    Physical Exam Triage Vital Signs ED Triage Vitals  Encounter Vitals Group     BP 03/21/23 1413 137/72     Systolic BP Percentile --      Diastolic BP Percentile --      Pulse Rate 03/21/23 1413 98     Resp 03/21/23 1413 18     Temp 03/21/23 1413 98.6 F (37 C)     Temp Source 03/21/23 1413 Oral      SpO2 03/21/23 1413 99 %     Weight --      Height --      Head Circumference --      Peak Flow --      Pain Score 03/21/23 1410 2     Pain Loc --      Pain Education --  Exclude from Growth Chart --    No data found.  Updated Vital Signs BP 137/72 (BP Location: Right Arm)   Pulse 98   Temp 98.6 F (37 C) (Oral)   Resp 18   LMP 03/07/2023 (Approximate)   SpO2 99%   Visual Acuity Right Eye Distance:   Left Eye Distance:   Bilateral Distance:    Right Eye Near:   Left Eye Near:    Bilateral Near:     Physical Exam Vitals and nursing note reviewed.  Constitutional:      General: She is not in acute distress.    Appearance: She is well-developed.  HENT:     Head: Normocephalic and atraumatic.  Eyes:     Conjunctiva/sclera: Conjunctivae normal.  Cardiovascular:     Rate and Rhythm: Normal rate and regular rhythm.     Heart sounds: No murmur heard. Pulmonary:     Effort: Pulmonary effort is normal. No respiratory distress.     Breath sounds: Normal breath sounds.  Abdominal:     Palpations: Abdomen is soft.     Tenderness: There is generalized abdominal tenderness (Mild). There is guarding and rebound.  Musculoskeletal:        General: No swelling.     Cervical back: Neck supple.  Skin:    General: Skin is warm and dry.     Capillary Refill: Capillary refill takes less than 2 seconds.  Neurological:     Mental Status: She is alert.  Psychiatric:        Mood and Affect: Mood normal.      UC Treatments / Results  Labs (all labs ordered are listed, but only abnormal results are displayed) Labs Reviewed - No data to display  EKG   Radiology No results found.  Procedures Procedures (including critical care time)  Medications Ordered in UC Medications  metoCLOPramide (REGLAN) injection 10 mg (has no administration in time range)    Initial Impression / Assessment and Plan / UC Course  I have reviewed the triage vital signs and the nursing  notes.  Pertinent labs & imaging results that were available during my care of the patient were reviewed by me and considered in my medical decision making (see chart for details).     Gastroenteritis   Symptoms seem most consistent with a gastroenteritis and have improved throughout the day as well as with Reglan that was given today.  Will send home with Reglan to take by mouth and monitor symptoms.  Take the next dose in 6 hours.  If symptoms worsen at all or you become unable to keep any fluids down then recommend going to the emergency room for further evaluation.  If you continue to have persistent abdominal pain with mild nausea and vomiting but no worsening of your symptoms, recommend contacting your PCP to see if this could be a side effect of Wegovy.  Can return to urgent care if needed.  Final Clinical Impressions(s) / UC Diagnoses   Final diagnoses:  None   Discharge Instructions   None    ED Prescriptions   None    PDMP not reviewed this encounter.   Landis Martins, [place] 03/21/23 1516

## 2023-03-21 NOTE — Discharge Instructions (Addendum)
 Symptoms seem most consistent with a gastroenteritis and have improved throughout the day as well as with Reglan that was given today.  Will send home with Reglan to take by mouth and monitor symptoms.  Take the next dose in 6 hours.  If symptoms worsen at all or you become unable to keep any fluids down then recommend going to the emergency room for further evaluation.  If you continue to have persistent abdominal pain with mild nausea and vomiting but no worsening of your symptoms, recommend contacting your PCP to see if this could be a side effect of Wegovy.  Can return to urgent care if needed.

## 2023-03-21 NOTE — ED Triage Notes (Signed)
 Patient presents to Urgent Care with complaints of vomiting, abdomen pain, cramping since this morning after eating. Patient reports pain has improved. Last time vomited was 2 hrs ago. Does have nausea as well. Did take Wegovy, stopped about 2 weeks ago but started back. Does have constipation. Last bowel movement was 3 days ago. Denies any fever.

## 2023-10-14 ENCOUNTER — Other Ambulatory Visit: Payer: Self-pay | Admitting: Family Medicine

## 2023-10-14 DIAGNOSIS — R519 Headache, unspecified: Secondary | ICD-10-CM

## 2023-10-21 ENCOUNTER — Ambulatory Visit
Admission: RE | Admit: 2023-10-21 | Discharge: 2023-10-21 | Disposition: A | Discharge status: Home / Self Care | Source: Ambulatory Visit | Attending: Family Medicine | Admitting: Family Medicine

## 2023-10-21 DIAGNOSIS — R519 Headache, unspecified: Secondary | ICD-10-CM

## 2023-11-29 ENCOUNTER — Ambulatory Visit: Admitting: Dermatology

## 2023-11-29 ENCOUNTER — Encounter: Payer: Self-pay | Admitting: Dermatology

## 2023-11-29 VITALS — BP 113/86

## 2023-11-29 DIAGNOSIS — L718 Other rosacea: Secondary | ICD-10-CM

## 2023-11-29 MED ORDER — DOXYCYCLINE HYCLATE 100 MG PO TABS: 100.0000 mg | ORAL_TABLET | Freq: Every day | ORAL | 2 refills | Status: AC

## 2023-11-29 MED ORDER — PIMECROLIMUS 1 % EX CREA: TOPICAL_CREAM | CUTANEOUS | 2 refills | Status: AC

## 2023-11-29 MED ORDER — METRONIDAZOLE 1 % EX GEL: CUTANEOUS | 2 refills | Status: DC

## 2023-11-29 MED ORDER — METRONIDAZOLE 1 % EX GEL: Freq: Every day | CUTANEOUS | 2 refills | Status: DC

## 2023-11-29 NOTE — Patient Instructions (Addendum)
 VISIT SUMMARY:  Today, you were seen for persistent facial dermatitis that you have been experiencing for the past two to three years. We discussed your symptoms, previous treatments, and performed an assessment to determine the best course of action moving forward.  YOUR PLAN:  -GRANULOMATOUS ROSACEA OF THE FACE: Granulomatous rosacea is a type of chronic skin condition that causes redness, swelling, and acne-like bumps on the face.  You have been prescribed doxycycline 100 mg to take with dinner for 3 months, and a topical treatment of pimecrolimus mixed with metronidazole to apply every morning and night.   We have discontinued the ketoconazole cream and recommended using a gentle cleanser and Cicalfate by Avene moisturizer, for which samples have been provided.   We will follow up in 3 months to assess your improvement and consider a re-biopsy if necessary.   INSTRUCTIONS:  Please take doxycycline 100 mg with dinner every day for the next 3 months. Apply the pimecrolimus mixed with metronidazole topically every morning and night. Use the gentle cleanser and cic-dux by Avene moisturizer as recommended. We will have a follow-up appointment in 3 months to assess your progress and consider a re-biopsy if necessary.    Important Information  Due to recent changes in healthcare laws, you may see results of your pathology and/or laboratory studies on MyChart before the doctors have had a chance to review them. We understand that in some cases there may be results that are confusing or concerning to you. Please understand that not all results are received at the same time and often the doctors may need to interpret multiple results in order to provide you with the best plan of care or course of treatment. Therefore, we ask that you please give us  2 business days to thoroughly review all your results before contacting the office for clarification. Should we see a critical lab result, you will be  contacted sooner.   If You Need Anything After Your Visit  If you have any questions or concerns for your doctor, please call our main line at [phone] If no one answers, please leave a voicemail as directed and we will return your call as soon as possible. Messages left after 4 pm will be answered the following business day.   You may also send us  a message via MyChart. We typically respond to MyChart messages within 1-2 business days.  For prescription refills, please ask your pharmacy to contact our office. Our fax number is [phone].  If you have an urgent issue when the clinic is closed that cannot wait until the next business day, you can page your doctor at the number below.    Please note that while we do our best to be available for urgent issues outside of office hours, we are not available 24/7.   If you have an urgent issue and are unable to reach us , you may choose to seek medical care at your doctor's office, retail clinic, urgent care center, or emergency room.  If you have a medical emergency, please immediately call 911 or go to the emergency department. In the event of inclement weather, please call our main line at [phone] for an update on the status of any delays or closures.  Dermatology Medication Tips: Please keep the boxes that topical medications come in in order to help keep track of the instructions about where and how to use these. Pharmacies typically print the medication instructions only on the boxes and not directly on the medication tubes.  If your medication is too expensive, please contact our office at [phone] or send us  a message through MyChart.   We are unable to tell what your co-pay for medications will be in advance as this is different depending on your insurance coverage. However, we may be able to find a substitute medication at lower cost or fill out paperwork to get insurance to cover a needed medication.   If a prior  authorization is required to get your medication covered by your insurance company, please allow us  1-2 business days to complete this process.  Drug prices often vary depending on where the prescription is filled and some pharmacies may offer cheaper prices.  The website www.goodrx.com contains coupons for medications through different pharmacies. The prices here do not account for what the cost may be with help from insurance (it may be cheaper with your insurance), but the website can give you the price if you did not use any insurance.  - You can print the associated coupon and take it with your prescription to the pharmacy.  - You may also stop by our office during regular business hours and pick up a GoodRx coupon card.  - If you need your prescription sent electronically to a different pharmacy, notify our office through [facility] or by phone at [phone]

## 2023-11-29 NOTE — Addendum Note (Signed)
 Addended by: Fern Asmar U on: 11/29/2023 10:54 AM   Modules accepted: Orders

## 2023-11-29 NOTE — Progress Notes (Signed)
   New Patient Visit   Subjective  Maria Schaefer is a 48 y.o. female who presents for a NEW PATIENT appointment to be examined for the concerns as listed below.  Patient (and/or pt guardian) consented to the use of AI-assisted tools for note generation.  Dermatitis: Located at the face that started about 3 years ago she has some peeling and flaking. Pt stated that it does itch 8/10. Not painful or tender to touch. She has seen [facility] who Rx an antifungal (unsure of the name) but that did not help. She was also seen by Dr. Mollie 3 years ago who Rx hydrocortisone 2.5% cream that was used for 41mo with no change.   Are you nursing, pregnant or trying to conceive? No   The following portions of the chart were reviewed this encounter and updated as appropriate: medications, allergies, medical history  Review of Systems:  No other skin or systemic complaints except as noted in HPI or Assessment and Plan.  Objective  Well appearing patient in no apparent distress; mood and affect are within normal limits.   A focused examination was performed of the following areas: face   Relevant exam findings are noted in the Assessment and Plan.        Assessment & Plan    Granulomatous rosacea of the face Chronic erythematous, hypopigmented plaques with scaling on the face for 2-3 years. Previous treatments with ketoconazole cream and hydrocortisone were ineffective. Biopsy results unavailable in mychart (pt will release old record to our office). Differential diagnosis includes perioral dermatitis and rosacea, with granulomatous rosacea suspected due to bumpy, elevated inflammation and poor response to topicals alone. No oral antibiotics previously used. No known allergies to antibiotics except for Cipro.   - Prescribed doxycycline 100 mg with dinner for 3 months. - Prescribed pimecrolimus mixed with metronidazole topically every morning and night. - Discontinued ketoconazole cream. -  Recommended gentle cleanser and provided samples. - Recommended cic-dux by Avene moisturizer, provided samples. - Scheduled follow-up in 3 months to assess improvement and consider re-biopsy if necessary.    Return for 3-24mo , ROSACEA.   Documentation: I have reviewed the above documentation for accuracy and completeness, and I agree with the above.  I, Shirron Maranda, CMA, am acting as scribe for [employer], DO.   Delon Lenis, DO

## 2023-12-01 ENCOUNTER — Encounter: Payer: Self-pay | Admitting: Dermatology

## 2023-12-01 DIAGNOSIS — L718 Other rosacea: Secondary | ICD-10-CM

## 2023-12-05 ENCOUNTER — Encounter: Payer: Self-pay | Admitting: Dermatology

## 2023-12-05 MED ORDER — METRONIDAZOLE 0.75 % EX GEL: 1.0000 | Freq: Two times a day (BID) | CUTANEOUS | 2 refills | Status: AC

## 2023-12-05 NOTE — Telephone Encounter (Signed)
 Can you call pt and well what medicaiton he's referring to.  Thanks

## 2023-12-05 NOTE — Telephone Encounter (Signed)
 Yes

## 2024-03-29 ENCOUNTER — Ambulatory Visit: Admitting: Dermatology
